# Patient Record
Sex: Male | Born: 1935 | Race: White | Hispanic: No | Marital: Married | Smoking: Former smoker
Health system: Southern US, Community
[De-identification: ages and names within clinical notes are randomized; demographics above are authoritative.]

## PROBLEM LIST (undated history)

## (undated) DIAGNOSIS — F419 Anxiety disorder, unspecified: Secondary | ICD-10-CM

## (undated) DIAGNOSIS — I639 Cerebral infarction, unspecified: Secondary | ICD-10-CM

## (undated) DIAGNOSIS — E785 Hyperlipidemia, unspecified: Secondary | ICD-10-CM

## (undated) DIAGNOSIS — G629 Polyneuropathy, unspecified: Secondary | ICD-10-CM

## (undated) DIAGNOSIS — I739 Peripheral vascular disease, unspecified: Secondary | ICD-10-CM

## (undated) DIAGNOSIS — I340 Nonrheumatic mitral (valve) insufficiency: Secondary | ICD-10-CM

## (undated) DIAGNOSIS — I255 Ischemic cardiomyopathy: Secondary | ICD-10-CM

## (undated) DIAGNOSIS — R7989 Other specified abnormal findings of blood chemistry: Secondary | ICD-10-CM

## (undated) DIAGNOSIS — Z951 Presence of aortocoronary bypass graft: Secondary | ICD-10-CM

## (undated) DIAGNOSIS — I10 Essential (primary) hypertension: Secondary | ICD-10-CM

## (undated) DIAGNOSIS — R29898 Other symptoms and signs involving the musculoskeletal system: Secondary | ICD-10-CM

## (undated) DIAGNOSIS — I2510 Atherosclerotic heart disease of native coronary artery without angina pectoris: Secondary | ICD-10-CM

## (undated) DIAGNOSIS — Z7901 Long term (current) use of anticoagulants: Secondary | ICD-10-CM

## (undated) DIAGNOSIS — I493 Ventricular premature depolarization: Secondary | ICD-10-CM

## (undated) DIAGNOSIS — I509 Heart failure, unspecified: Secondary | ICD-10-CM

## (undated) DIAGNOSIS — I779 Disorder of arteries and arterioles, unspecified: Secondary | ICD-10-CM

## (undated) DIAGNOSIS — IMO0002 Reserved for concepts with insufficient information to code with codable children: Secondary | ICD-10-CM

## (undated) DIAGNOSIS — I4891 Unspecified atrial fibrillation: Secondary | ICD-10-CM

## (undated) DIAGNOSIS — R9430 Abnormal result of cardiovascular function study, unspecified: Secondary | ICD-10-CM

## (undated) DIAGNOSIS — I358 Other nonrheumatic aortic valve disorders: Secondary | ICD-10-CM

## (undated) DIAGNOSIS — K219 Gastro-esophageal reflux disease without esophagitis: Secondary | ICD-10-CM

## (undated) HISTORY — DX: Presence of aortocoronary bypass graft: Z95.1

## (undated) HISTORY — DX: Atherosclerotic heart disease of native coronary artery without angina pectoris: I25.10

## (undated) HISTORY — DX: Other specified abnormal findings of blood chemistry: R79.89

## (undated) HISTORY — DX: Nonrheumatic mitral (valve) insufficiency: I34.0

## (undated) HISTORY — DX: Hyperlipidemia, unspecified: E78.5

## (undated) HISTORY — DX: Peripheral vascular disease, unspecified: I73.9

## (undated) HISTORY — DX: Ventricular premature depolarization: I49.3

## (undated) HISTORY — DX: Other nonrheumatic aortic valve disorders: I35.8

## (undated) HISTORY — DX: Disorder of arteries and arterioles, unspecified: I77.9

## (undated) HISTORY — DX: Gastro-esophageal reflux disease without esophagitis: K21.9

## (undated) HISTORY — DX: Essential (primary) hypertension: I10

## (undated) HISTORY — DX: Other symptoms and signs involving the musculoskeletal system: R29.898

## (undated) HISTORY — DX: Unspecified atrial fibrillation: I48.91

## (undated) HISTORY — DX: Reserved for concepts with insufficient information to code with codable children: IMO0002

## (undated) HISTORY — DX: Ischemic cardiomyopathy: I25.5

## (undated) HISTORY — DX: Cerebral infarction, unspecified: I63.9

## (undated) HISTORY — DX: Long term (current) use of anticoagulants: Z79.01

## (undated) HISTORY — DX: Heart failure, unspecified: I50.9

## (undated) HISTORY — DX: Polyneuropathy, unspecified: G62.9

## (undated) HISTORY — DX: Anxiety disorder, unspecified: F41.9

## (undated) HISTORY — DX: Abnormal result of cardiovascular function study, unspecified: R94.30

---

## 1996-05-01 HISTORY — PX: CORONARY ARTERY BYPASS GRAFT: SHX141

## 1999-12-20 ENCOUNTER — Encounter: Payer: Self-pay | Admitting: Gastroenterology

## 1999-12-20 ENCOUNTER — Ambulatory Visit (HOSPITAL_COMMUNITY): Admission: RE | Admit: 1999-12-20 | Discharge: 1999-12-20 | Payer: Self-pay | Admitting: Gastroenterology

## 2002-05-09 ENCOUNTER — Encounter: Payer: Self-pay | Admitting: Specialist

## 2002-05-12 ENCOUNTER — Ambulatory Visit (HOSPITAL_COMMUNITY): Admission: RE | Admit: 2002-05-12 | Discharge: 2002-05-12 | Payer: Self-pay | Admitting: Specialist

## 2003-04-01 ENCOUNTER — Other Ambulatory Visit: Admission: RE | Admit: 2003-04-01 | Discharge: 2003-04-01 | Payer: Self-pay | Admitting: Dermatology

## 2004-06-06 ENCOUNTER — Ambulatory Visit: Payer: Self-pay | Admitting: Cardiology

## 2005-03-13 ENCOUNTER — Other Ambulatory Visit: Admission: RE | Admit: 2005-03-13 | Discharge: 2005-03-13 | Payer: Self-pay | Admitting: Dermatology

## 2005-06-20 ENCOUNTER — Ambulatory Visit: Payer: Self-pay | Admitting: Cardiology

## 2005-06-27 ENCOUNTER — Ambulatory Visit: Payer: Self-pay | Admitting: Cardiology

## 2007-03-19 ENCOUNTER — Ambulatory Visit: Payer: Self-pay | Admitting: Vascular Surgery

## 2007-06-03 ENCOUNTER — Ambulatory Visit: Payer: Self-pay | Admitting: Cardiology

## 2007-06-11 ENCOUNTER — Ambulatory Visit: Payer: Self-pay | Admitting: Cardiology

## 2007-06-11 ENCOUNTER — Encounter: Payer: Self-pay | Admitting: Cardiology

## 2007-07-12 ENCOUNTER — Ambulatory Visit: Payer: Self-pay | Admitting: Cardiology

## 2007-08-02 ENCOUNTER — Ambulatory Visit: Payer: Self-pay | Admitting: Cardiology

## 2007-09-05 ENCOUNTER — Ambulatory Visit: Payer: Self-pay | Admitting: Cardiology

## 2007-10-23 ENCOUNTER — Ambulatory Visit: Payer: Self-pay | Admitting: Cardiology

## 2007-10-23 ENCOUNTER — Encounter: Payer: Self-pay | Admitting: Cardiology

## 2007-11-05 ENCOUNTER — Ambulatory Visit: Payer: Self-pay | Admitting: Cardiology

## 2007-11-12 ENCOUNTER — Ambulatory Visit: Payer: Self-pay | Admitting: Vascular Surgery

## 2008-03-30 ENCOUNTER — Ambulatory Visit: Payer: Self-pay | Admitting: Cardiology

## 2008-04-07 ENCOUNTER — Encounter: Payer: Self-pay | Admitting: Cardiology

## 2008-05-08 ENCOUNTER — Ambulatory Visit: Payer: Self-pay | Admitting: Cardiology

## 2008-05-14 ENCOUNTER — Encounter: Payer: Self-pay | Admitting: Cardiology

## 2008-05-14 ENCOUNTER — Ambulatory Visit: Payer: Self-pay | Admitting: Cardiology

## 2008-06-15 ENCOUNTER — Encounter: Payer: Self-pay | Admitting: Cardiology

## 2008-06-20 ENCOUNTER — Encounter: Payer: Self-pay | Admitting: Cardiology

## 2008-06-23 ENCOUNTER — Encounter: Payer: Self-pay | Admitting: Cardiology

## 2008-09-07 ENCOUNTER — Encounter: Payer: Self-pay | Admitting: Cardiology

## 2008-11-16 ENCOUNTER — Ambulatory Visit: Payer: Self-pay | Admitting: Vascular Surgery

## 2008-11-18 ENCOUNTER — Encounter (INDEPENDENT_AMBULATORY_CARE_PROVIDER_SITE_OTHER): Payer: Self-pay | Admitting: *Deleted

## 2008-12-15 ENCOUNTER — Encounter (INDEPENDENT_AMBULATORY_CARE_PROVIDER_SITE_OTHER): Payer: Self-pay | Admitting: *Deleted

## 2008-12-16 ENCOUNTER — Encounter: Payer: Self-pay | Admitting: Physician Assistant

## 2008-12-16 ENCOUNTER — Ambulatory Visit: Payer: Self-pay | Admitting: Cardiology

## 2009-07-19 ENCOUNTER — Encounter: Payer: Self-pay | Admitting: Cardiology

## 2009-07-21 ENCOUNTER — Ambulatory Visit: Payer: Self-pay | Admitting: Cardiology

## 2009-07-28 ENCOUNTER — Telehealth (INDEPENDENT_AMBULATORY_CARE_PROVIDER_SITE_OTHER): Payer: Self-pay | Admitting: *Deleted

## 2009-08-06 ENCOUNTER — Encounter (INDEPENDENT_AMBULATORY_CARE_PROVIDER_SITE_OTHER): Payer: Self-pay | Admitting: Neurology

## 2009-08-06 ENCOUNTER — Ambulatory Visit: Payer: Self-pay | Admitting: Internal Medicine

## 2009-08-06 ENCOUNTER — Encounter (INDEPENDENT_AMBULATORY_CARE_PROVIDER_SITE_OTHER): Payer: Self-pay | Admitting: Internal Medicine

## 2009-08-06 ENCOUNTER — Inpatient Hospital Stay (HOSPITAL_COMMUNITY): Admission: AD | Admit: 2009-08-06 | Discharge: 2009-08-10 | Payer: Self-pay | Admitting: Internal Medicine

## 2009-08-06 ENCOUNTER — Encounter: Payer: Self-pay | Admitting: Cardiology

## 2009-08-08 ENCOUNTER — Encounter (INDEPENDENT_AMBULATORY_CARE_PROVIDER_SITE_OTHER): Payer: Self-pay | Admitting: Internal Medicine

## 2009-08-09 ENCOUNTER — Encounter (INDEPENDENT_AMBULATORY_CARE_PROVIDER_SITE_OTHER): Payer: Self-pay | Admitting: Internal Medicine

## 2009-08-10 ENCOUNTER — Encounter: Payer: Self-pay | Admitting: Internal Medicine

## 2009-12-15 ENCOUNTER — Ambulatory Visit: Payer: Self-pay | Admitting: Vascular Surgery

## 2009-12-15 ENCOUNTER — Encounter: Payer: Self-pay | Admitting: Cardiology

## 2010-01-31 ENCOUNTER — Encounter: Payer: Self-pay | Admitting: Cardiology

## 2010-02-01 ENCOUNTER — Ambulatory Visit: Payer: Self-pay | Admitting: Cardiology

## 2010-02-07 ENCOUNTER — Encounter: Payer: Self-pay | Admitting: Cardiology

## 2010-02-10 ENCOUNTER — Encounter (INDEPENDENT_AMBULATORY_CARE_PROVIDER_SITE_OTHER): Payer: Self-pay | Admitting: *Deleted

## 2010-05-31 NOTE — Miscellaneous (Signed)
  Clinical Lists Changes  Problems: Removed problem of MITRAL VALVE DISORDERS (ICD-424.0) Removed problem of COR ATHEROSLERO UNSPEC TYPE VESSEL NATIVE/GRAFT (ICD-414.00) Added new problem of CAD (ICD-414.00) Added new problem of CORONARY ARTERY BYPASS GRAFT, HX OF (ICD-V45.81) Added new problem of GERD (ICD-530.81) Added new problem of * ANXIETY Added new problem of * PERIPHERAL NEUROPATHY Added new problem of MITRAL REGURGITATION (ICD-396.3) Added new problem of * PVCS Observations: Added new observation of PAST MED HX: Ischemic cardiomyopathy. EF  40-45%..  (better than before).... echo... January, 2010.... ICD not indicated CHF.... systolic CABG.... 1998 CAD....    nuclear August of 2005... extensive lateral and inferolateral scar.... no ischemia Dyslipidemia. Hypertension. GERD Anxiety. carotid artery disease..... Dr. Cari Caraway.Marland KitchenMarland KitchenMarland KitchenDoppler... July, 2009... 0-39% RICA, 40-59% LICA Peripheral neuropathy. Question of peripheral vascular disease in his legs. continued spells of weakness in his legs. Not clear if vascular or neurologic. Arterial leg Dopplers normal, December 2009. aortic leaflet calcification...mild... no significant aortic stenosis.  Mitral regurgitation, mild by echo, January 2010. PVCs  (07/19/2009 16:37) Added new observation of PRIMARY MD: Donzetta Sprung, MD (07/19/2009 16:37)       Past History:  Past Medical History: Ischemic cardiomyopathy. EF  40-45%..  (better than before).... echo... January, 2010.... ICD not indicated CHF.... systolic CABG.... 1998 CAD....    nuclear August of 2005... extensive lateral and inferolateral scar.... no ischemia Dyslipidemia. Hypertension. GERD Anxiety. carotid artery disease..... Dr. Cari Caraway.Marland KitchenMarland KitchenMarland KitchenDoppler... July, 2009... 0-39% RICA, 40-59% LICA Peripheral neuropathy. Question of peripheral vascular disease in his legs. continued spells of weakness in his legs. Not clear if vascular or neurologic. Arterial  leg Dopplers normal, December 2009. aortic leaflet calcification...mild... no significant aortic stenosis.  Mitral regurgitation, mild by echo, January 2010. PVCs

## 2010-05-31 NOTE — Assessment & Plan Note (Signed)
Summary: 6 mon ful fholt   Visit Type:  Follow-up Referring Provider:  Rinaldo Ratel Primary Provider:  Donzetta Sprung, MD  CC:  CAD  CVA.  History of Present Illness: The patient is seen post hospitalization.  He had a CVA and was treated at [facility].  He presented with the acute onset of left lower arm weakness and inability to respond speech appropriately.  This all improved.  It was noted that he did have some paroxysmal atrial fibrillation that has not been seen before.  It was felt that his event was cardioembolic.  He was placed on Pradaxa.  He has done well since.  I have reviewed the ostial consultation was done by cardiology during that time.  He had some mild congestive heart failure with slight troponin elevation.  This was treated as heart failure and it was felt that he did not need cardiac catheterization.  Since that time he has also been seen by Dr. Edilia Bo.  His carotid Dopplers are stable.  I have reviewed the entire discharge summary from his hospitalization very carefully.  Preventive Screening-Counseling & Management  Alcohol-Tobacco     Smoking Status: quit     Year Quit: 1984  Current Medications (verified): 1)  Losartan Potassium 100 Mg Tabs (Losartan Potassium) .... Take 1 Tablet By Mouth Once A Day 2)  Omeprazole 20 Mg Cpdr (Omeprazole) .... Once Daily 3)  Simvastatin 40 Mg Tabs (Simvastatin) .... Once Daily 4)  Gabapentin 300 Mg Caps (Gabapentin) .... Two Times A Day 5)  Flomax 0.4 Mg Xr24h-Cap (Tamsulosin Hcl) .... Once Daily 6)  Diazepam 5 Mg Tabs (Diazepam) .... Take 1/2 Tablet By Mouth Two Times A Day 7)  Carvedilol 25 Mg Tabs (Carvedilol) .... Take 1 Tablet By Mouth Once A Day 8)  Travatan Z 0.004 % Soln (Travoprost) .Marland Kitchen.. 1 Drop To (R) Eye Every Evening 9)  Brimonidine Tartrate 0.15 % Soln (Brimonidine Tartrate) .... 2 Drops in (R) Eye Two Times A Day 10)  Artificial Tears  Soln (Artificial Tear Solution) .... (L) Eye Once Daily 11)  Cardizem Cd 180 Mg  Xr24h-Cap (Diltiazem Hcl Coated Beads) .... Take 1 Tablet By Mouth Once A Day 12)  Metformin Hcl 500 Mg Tabs (Metformin Hcl) .... Take 1 Tablet By Mouth Two Times A Day 13)  Loratadine 10 Mg Tabs (Loratadine) .... Take 1 Tablet By Mouth Once A Day 14)  Pradaxa 150 Mg Caps (Dabigatran Etexilate Mesylate) .... Take 1 Tablet By Mouth Two Times A Day  Allergies (verified): No Known Drug Allergies  Comments:  Nurse/Medical Assistant: The patient's medication list and allergies were reviewed with the patient and were updated in the Medication and Allergy Lists.  Past History:  Past Medical History: Ischemic cardiomyopathy. EF  40-45%..  (better than before).... echo... January, 2010.... ICD not indicated /  EF 40%...echo..08/06/2009 CHF.... systolic CABG.... 1998 CAD....    nuclear August of 2005... extensive lateral and inferolateral scar.... no ischemia Dyslipidemia. DM   diagnosed..07/2009 Hypertension. GERD Anxiety. carotid artery disease..... Dr. Cari Caraway.Marland KitchenMarland KitchenMarland KitchenDoppler... July, 2009... 0-39% RICA, 40-59% LICA /  doppler..11/2009.Marland Kitchen40-59% LICA per Dr. Edilia Bo CVA   07/2009...likely cardioembolic Atrial fib/ flutter rate contralled...Marland KitchenMarland Kitchen4/2011.Marland KitchenCoumadin Coumadin   begun 07/2009 Peripheral neuropathy. Question of peripheral vascular disease in his legs. continued spells of weakness in his legs. Not clear if vascular or neurologic. Arterial leg Dopplers normal, December 2009. aortic leaflet calcification...mild... no significant aortic stenosis.  Mitral regurgitation, mild by echo, January 2010. PVCs  Review of Systems  Patient denies fever, chills, headache, sweats, rash, change in vision, change in hearing, chest pain, cough, nausea vomiting, urinary symptoms.  All other systems are reviewed and are negative.  Vital Signs:  Patient profile:   77 year old male Height:      73 inches Weight:      251 pounds BMI:     33.24 Pulse rate:   52 / minute BP sitting:   118 / 82   (left arm) Cuff size:   large  Vitals Entered By: Carlye Grippe (February 01, 2010 2:59 PM)  Nutrition Counseling: Patient's BMI is greater than 25 and therefore counseled on weight management options.  Physical Exam  General:  patient is stable today. Head:  head is atraumatic. Eyes:  no xanthelasma. Neck:  no jugular venous stent. Chest Wall:  no chest wall tenderness. Lungs:  lungs are clear.  Respiratory effort is nonlabored. Heart:  cardiac exam reveals S1 and S2.  No clicks or significant murmurs. Abdomen:  abdomen is soft. Msk:  no musculoskeletal deformities. Extremities:  no peripheral edema. Skin:  no skin rashes. Psych:  patient is oriented to person time and place.  Affect is normal.  He is here with his wife today.   Impression & Recommendations:  Problem # 1:  * PRADAXA RX The patient was placed on this medication for his cardioembolic event.  I am in agreement with this.  His renal function is good.  We will plan followup hematology studies appropriately.  This will be long-term therapy.  Problem # 2:  CVA (ICD-434.91)  The following medications were removed from the medication list:    Aspirin Low Dose 81 Mg Tabs (Aspirin) ..... Once daily Fortunately he is recovered from his CVA.  The only residual is very slight tingling in his left hand.  He is back to full activities.  This will be treated as a cardioembolic event.  There was some atrial fibrillation seen at the time of his hospitalization.  Problem # 3:  MITRAL REGURGITATION (ICD-396.3) This is not a significant problem at this time.  Followup echo was not needed.  Problem # 4:  CAD (ICD-414.00)  The following medications were removed from the medication list:    Lisinopril 40 Mg Tabs (Lisinopril) ..... Once daily    Aspirin Low Dose 81 Mg Tabs (Aspirin) ..... Once daily His updated medication list for this problem includes:    Carvedilol 25 Mg Tabs (Carvedilol) .Marland Kitchen... Take 1 tablet by mouth once a day     Cardizem Cd 180 Mg Xr24h-cap (Diltiazem hcl coated beads) .Marland Kitchen... Take 1 tablet by mouth once a day Coronary disease is stable.  He had slight troponin elevation the hospital related to CHF.  Problem # 5:  CAROTID ARTERY DISEASE (ICD-433.10)  The following medications were removed from the medication list:    Aspirin Low Dose 81 Mg Tabs (Aspirin) ..... Once daily Carotid artery disease is stable.  I reviewed the note from his vascular surgeon.  Problem # 6:  ESSENTIAL HYPERTENSION, BENIGN (ICD-401.1)  The following medications were removed from the medication list:    Lisinopril 40 Mg Tabs (Lisinopril) ..... Once daily    Aspirin Low Dose 81 Mg Tabs (Aspirin) ..... Once daily His updated medication list for this problem includes:    Losartan Potassium 100 Mg Tabs (Losartan potassium) .Marland Kitchen... Take 1 tablet by mouth once a day    Carvedilol 25 Mg Tabs (Carvedilol) .Marland Kitchen... Take 1 tablet by mouth once a day  Cardizem Cd 180 Mg Xr24h-cap (Diltiazem hcl coated beads) .Marland Kitchen... Take 1 tablet by mouth once a day Blood pressure stable.  No change in therapy.  Other Orders: T-Basic Metabolic Panel [phone]) T-CBC No Diff ([id number]) T-Hepatic Function [phone])  Patient Instructions: 1)  Your physician wants you to follow-up in: 4 months. You will receive a reminder letter in the mail one-two months in advance. If you don't receive a letter, please call our office to schedule the follow-up appointment. 2)  Your physician recommends that you go to the [facility] for lab work: Do not eat or drink after midnight.  3)  Your physician recommends that you continue on your current medications as directed. Please refer to the Current Medication list given to you today.

## 2010-05-31 NOTE — Progress Notes (Signed)
Summary: PHONE: LETTER  Phone Note Call from Patient Call back at Home Phone [phone]   Caller: Patient   [phone]  work # Reason for Call: Talk to Doctor Summary of Call: Mr. Pietrzyk states that when he was seen in office earlier this month that Dr. Myrtis Ser was going to write a letter to Dr. Chaney Malling regarding his knees. Do not see a note in regards to this. Initial call taken by: Zachary George,  July 28, 2009 10:36 AM  Follow-up for Phone Call        the origininal office note was sent to [facility] in [place] which contained message r/e knee discomfort. nurse resent office note to [facility]. faxed to [phone]. Patient informed of the above.   Follow-up by: Carlye Grippe,  July 28, 2009 4:59 PM

## 2010-05-31 NOTE — Miscellaneous (Signed)
  Clinical Lists Changes  Problems: Added new problem of CVA (ICD-434.91) Observations: Added new observation of PAST MED HX: Ischemic cardiomyopathy. EF  40-45%..  (better than before).... echo... January, 2010.... ICD not indicated /  EF 40%...echo..08/06/2009 CHF.... systolic CABG.... 1998 CAD....    nuclear August of 2005... extensive lateral and inferolateral scar.... no ischemia Dyslipidemia. DM   diagnosed..07/2009 Hypertension. GERD Anxiety. carotid artery disease..... Dr. Cari Caraway.Marland KitchenMarland KitchenMarland KitchenDoppler... July, 2009... 0-39% RICA, 40-59% LICA /  doppler..11/2009.Marland Kitchen40-59% LICA per Dr. Edilia Bo CVA   07/2009...likely cardioembolic Atrial fib/ flutter rate contralled...Marland KitchenMarland Kitchen4/2011.Marland KitchenCoumadin Coumadin   begun 07/2009 Peripheral neuropathy. Question of peripheral vascular disease in his legs. continued spells of weakness in his legs. Not clear if vascular or neurologic. Arterial leg Dopplers normal, December 2009. aortic leaflet calcification...mild... no significant aortic stenosis.  Mitral regurgitation, mild by echo, January 2010. PVCs  (01/31/2010 10:50) Added new observation of REFERRING MD: Rinaldo Ratel (01/31/2010 10:50) Added new observation of PRIMARY MD: Donzetta Sprung, MD (01/31/2010 10:50)       Past History:  Past Medical History: Ischemic cardiomyopathy. EF  40-45%..  (better than before).... echo... January, 2010.... ICD not indicated /  EF 40%...echo..08/06/2009 CHF.... systolic CABG.... 1998 CAD....    nuclear August of 2005... extensive lateral and inferolateral scar.... no ischemia Dyslipidemia. DM   diagnosed..07/2009 Hypertension. GERD Anxiety. carotid artery disease..... Dr. Cari Caraway.Marland KitchenMarland KitchenMarland KitchenDoppler... July, 2009... 0-39% RICA, 40-59% LICA /  doppler..11/2009.Marland Kitchen40-59% LICA per Dr. Edilia Bo CVA   07/2009...likely cardioembolic Atrial fib/ flutter rate contralled...Marland KitchenMarland Kitchen4/2011.Marland KitchenCoumadin Coumadin   begun 07/2009 Peripheral neuropathy. Question of peripheral vascular  disease in his legs. continued spells of weakness in his legs. Not clear if vascular or neurologic. Arterial leg Dopplers normal, December 2009. aortic leaflet calcification...mild... no significant aortic stenosis.  Mitral regurgitation, mild by echo, January 2010. PVCs

## 2010-05-31 NOTE — Assessment & Plan Note (Signed)
Summary: 6 MO FU PER FEB REMINDER-SRS   Visit Type:  Follow-up Referring Provider:  Rinaldo Ratel Primary Provider:  Donzetta Sprung, MD  CC:  CAD.  History of Present Illness: The patient is seen for followup of coronary artery disease.  I saw him last August, 2010.  In January of [address]84 the echo had been done to reassess his LV function and ejection fraction was 40-45%.  There is a history of CABG 1998.  Nuclear scan was done 2005.  As no ischemia then.  Is not having chest pain.  The patient is very limited by his knee discomfort.  He is completely limited by this.  I certainly favor a more complete workup.  Preventive Screening-Counseling & Management  Alcohol-Tobacco     Smoking Status: quit  Comments: Pt quit about 23 yrs ago. Smoked for about 15-20 yrs  Current Medications (verified): 1)  Lisinopril 40 Mg Tabs (Lisinopril) .... Once Daily 2)  Amlodipine Besylate 5 Mg Tabs (Amlodipine Besylate) .... Once Daily 3)  Omeprazole 20 Mg Cpdr (Omeprazole) .... Once Daily 4)  Simvastatin 40 Mg Tabs (Simvastatin) .... Once Daily 5)  Gabapentin 300 Mg Caps (Gabapentin) .... Two Times A Day 6)  Flomax 0.4 Mg Xr24h-Cap (Tamsulosin Hcl) .... Once Daily 7)  Aspirin Low Dose 81 Mg Tabs (Aspirin) .... Once Daily 8)  Diazepam 5 Mg Tabs (Diazepam) .... 1/2 Tab 2  Times A Day 9)  Carvedilol 25 Mg Tabs (Carvedilol) .... Two Times A Day 10)  Mentax 1 % Crea (Butenafine Hcl) .... As Needed 11)  Travatan Z 0.004 % Soln (Travoprost) .Marland Kitchen.. 1 Drop To (R) Eye Every Evening 12)  Brimonidine Tartrate 0.15 % Soln (Brimonidine Tartrate) .... 2 Drops in (R) Eye Two Times A Day 13)  Artificial Tears  Soln (Artificial Tear Solution) .... (L) Eye Once Daily  Allergies: No Known Drug Allergies  Comments:  Nurse/Medical Assistant: The patient's medications were reviewed with the patient and were updated in the Medication List. Pt brought a list of medications to office visit.  Cyril Loosen, RN, BSN  (July 21, 2009 12:32 PM)  Past History:  Past Medical History: Last updated: 07/19/2009 Ischemic cardiomyopathy. EF  40-45%..  (better than before).... echo... January, 2010.... ICD not indicated CHF.... systolic CABG.... 1998 CAD....    nuclear August of 2005... extensive lateral and inferolateral scar.... no ischemia Dyslipidemia. Hypertension. GERD Anxiety. carotid artery disease..... Dr. Cari Caraway.Marland KitchenMarland KitchenMarland KitchenDoppler... July, 2009... 0-39% RICA, 40-59% LICA Peripheral neuropathy. Question of peripheral vascular disease in his legs. continued spells of weakness in his legs. Not clear if vascular or neurologic. Arterial leg Dopplers normal, December 2009. aortic leaflet calcification...mild... no significant aortic stenosis.  Mitral regurgitation, mild by echo, January 2010. PVCs  Social History: Smoking Status:  quit  Review of Systems       Patient denies fever, chills, headache, sweats, rash, change in vision, change in hearing, chest pain, cough, shortness of breath.  He has no nausea vomiting or urinary symptoms.  He is very limited by his knee discomfort. all other systems are reviewed and are negative.  Vital Signs:  Patient profile:   77 year old male Height:      73 inches Weight:      262.25 pounds Pulse rate:   58 / minute BP sitting:   115 / 69  (left arm) Cuff size:   large  Vitals Entered By: Cyril Loosen, RN, BSN (July 21, 2009 12:26 PM) CC: CAD Comments No cardiac compliants   Physical  Exam  General:  The patient is stable. Eyes:  no xanthelasma. Neck:  no jugular venous distention. Lungs:  lungs are clear.  Respiratory effort is nonlabored. Heart:  cardiac exam is S1-S2.  No clicks or significant murmurs. Abdomen:  abdomen is soft. Extremities:  no peripheral edema. Psych:  patient is oriented to person time and place.  Affect is normal.   Impression & Recommendations:  Problem # 1:  * PVCS Patient is not bothered by palpitations at this  time.  No further workup.  Problem # 2:  MITRAL REGURGITATION (ICD-396.3) The patient does not need followup echo at this time.  Problem # 3:  CAD (ICD-414.00)  His updated medication list for this problem includes:    Lisinopril 40 Mg Tabs (Lisinopril) ..... Once daily    Amlodipine Besylate 5 Mg Tabs (Amlodipine besylate) ..... Once daily    Aspirin Low Dose 81 Mg Tabs (Aspirin) ..... Once daily    Carvedilol 25 Mg Tabs (Carvedilol) .Marland Kitchen..Marland Kitchen Two times a day Coronary disease is stable.  I've chosen not to proceed with any type of exercise testing at this time.  His cholesterol is under good control.  Problem # 4:  CAROTID ARTERY DISEASE (ICD-433.10)  His updated medication list for this problem includes:    Aspirin Low Dose 81 Mg Tabs (Aspirin) ..... Once daily His carotid disease is followed very carefully by Dr. Quintella Baton.  No further workup.  Problem # 5:  * KNEE DISCOMFORT Patient is extremely limited by dyspnea discomfort.  It is my understanding that the insurance company requested rehabilitation before they were willing to consider thing for MRI.  The patient is not able to go through a rehabilitation.  He is affected significantly and needs further evaluation of his knees.  I am hopeful that the insurance company will reconsider.  Patient Instructions: 1)  Your physician recommends that you continue on your current medications as directed. Please refer to the Current Medication list given to you today. 2)  Your physician wants you to follow-up in: 6months. You will receive a reminder letter in the mail about two months in advance. If you don't receive a letter, please call our office to schedule the follow-up appointment.

## 2010-05-31 NOTE — Consult Note (Signed)
Summary: [facility]   Imported By: Marylou Mccoy 11/23/2009 17:44:30  _____________________________________________________________________  External Attachment:    Type:   Image     Comment:   External Document

## 2010-05-31 NOTE — Letter (Signed)
Summary: [facility] New Patient Consult   [facility] New Patient Consult   Imported By: Roderic Ovens 01/14/2010 11:14:33  _____________________________________________________________________  External Attachment:    Type:   Image     Comment:   External Document

## 2010-05-31 NOTE — Letter (Signed)
Summary: Engineer, materials at [facility]  [address]   [place], [place] [address]   Phone: [phone]  Fax: [phone]        February 10, 2010 MRN: [id number]    [facility] [place], [place]  [address]    Dear Mr. Blansett,  Your test ordered by Selena Batten has been reviewed by your physician (or physician assistant) and was found to be normal or stable. Your physician (or physician assistant) felt no changes were needed at this time.  ____ Echocardiogram  ____ Cardiac Stress Test  __X__ Lab Work  ____ Peripheral vascular study of arms, legs or neck  ____ CT scan or X-ray  ____ Lung or Breathing test  ____ Other:   Thank you.   Cyril Loosen, RN, BSN    Duane Boston, M.D., F.A.C.C. Thressa Sheller, M.D., F.A.C.C. Oneal Grout, M.D., F.A.C.C. Cheree Ditto, M.D., F.A.C.C. Daiva Nakayama, M.D., F.A.C.C. Kenney Houseman, M.D., F.A.C.C. Jeanne Ivan, PA-C

## 2010-07-04 ENCOUNTER — Encounter: Payer: Self-pay | Admitting: Cardiology

## 2010-07-04 ENCOUNTER — Ambulatory Visit (INDEPENDENT_AMBULATORY_CARE_PROVIDER_SITE_OTHER): Payer: MEDICARE | Admitting: Cardiology

## 2010-07-04 DIAGNOSIS — I4891 Unspecified atrial fibrillation: Secondary | ICD-10-CM

## 2010-07-12 NOTE — Assessment & Plan Note (Signed)
Summary: 4 month fu-recv reminder -vs   Visit Type:  Follow-up Referring Provider:  Rinaldo Ratel Primary Provider:  Donzetta Sprung, MD  CC:  CAD.  History of Present Illness: Patient is seen for followup of coronary disease, left ventricular dysfunction, carotid artery disease, atrial fibrillation, Coumadin therapy.  Overall he is doing well.  He's not having any chest pain or significant shortness of breath.  Preventive Screening-Counseling & Management  Alcohol-Tobacco     Smoking Status: quit     Year Quit: 1984  Current Medications (verified): 1)  Losartan Potassium 100 Mg Tabs (Losartan Potassium) .... Take 1 Tablet By Mouth Once A Day 2)  Omeprazole 20 Mg Cpdr (Omeprazole) .... Once Daily 3)  Simvastatin 40 Mg Tabs (Simvastatin) .... Once Daily 4)  Gabapentin 300 Mg Caps (Gabapentin) .... Two Times A Day 5)  Flomax 0.4 Mg Xr24h-Cap (Tamsulosin Hcl) .... Once Daily 6)  Diazepam 5 Mg Tabs (Diazepam) .... Take 1/2 Tablet By Mouth Two Times A Day 7)  Carvedilol 25 Mg Tabs (Carvedilol) .... Take 1 Tablet By Mouth Once A Day 8)  Travatan Z 0.004 % Soln (Travoprost) .Marland Kitchen.. 1 Drop To (R) Eye Every Evening 9)  Brimonidine Tartrate 0.15 % Soln (Brimonidine Tartrate) .... 2 Drops in (R) Eye Two Times A Day 10)  Artificial Tears  Soln (Artificial Tear Solution) .... (L) Eye Once Daily 11)  Cardizem Cd 180 Mg Xr24h-Cap (Diltiazem Hcl Coated Beads) .... Take 1 Tablet By Mouth Once A Day 12)  Metformin Hcl 500 Mg Tabs (Metformin Hcl) .... Take 1 Tablet By Mouth Two Times A Day 13)  Loratadine 10 Mg Tabs (Loratadine) .... Take 1 Tablet By Mouth Once A Day 14)  Pradaxa 150 Mg Caps (Dabigatran Etexilate Mesylate) .... Take 1 Tablet By Mouth Two Times A Day  Allergies (verified): No Known Drug Allergies  Comments:  Nurse/Medical Assistant: The patient's medication list and allergies were reviewed with the patient and were updated in the Medication and Allergy Lists.  Past  History:  Past Medical History: Ischemic cardiomyopathy. EF  40-45%..  (better than before).... echo... January, 2010.... ICD not indicated /  EF 40%...echo..08/06/2009 CHF.... systolic CABG.... 1998 CAD....    nuclear August of 2005... extensive lateral and inferolateral scar.... no ischemia Dyslipidemia. DM   diagnosed..07/2009 Hypertension. GERD Anxiety. carotid artery disease..... Dr. Cari Caraway.Marland KitchenMarland KitchenMarland KitchenDoppler... July, 2009... 0-39% RICA, 40-59% LICA /  doppler..11/2009.Marland Kitchen40-59% LICA per Dr. Edilia Bo CVA   07/2009...likely cardioembolic.. Atrial fib/ flutter rate contralled...Marland KitchenMarland Kitchen4/2011.Marland KitchenCoumadin Coumadin   begun 07/2009 Peripheral neuropathy. Question of peripheral vascular disease in his legs. continued spells of weakness in his legs. Not clear if vascular or neurologic. Arterial leg Dopplers normal, December 2009. aortic leaflet calcification...mild... no significant aortic stenosis.  Mitral regurgitation, mild by echo, January 2010. PVCs  Review of Systems       Patient denies fever, chills, headache, sweats, rash, change in vision, change in hearing, chest pain, cough, nausea vomiting, urinary symptoms.  All other systems are reviewed and are negative. He does have problems with his legs.  Vital Signs:  Patient profile:   77 year old male Height:      73 inches Weight:      257 pounds O2 Sat:      94 % on Room air Pulse rate:   61 / minute BP sitting:   128 / 63  (left arm) Cuff size:   large  Vitals Entered By: Carlye Grippe (July 04, 2010 2:36 PM)  O2 Flow:  Room  air  Physical Exam  General:  patient is stable. Eyes:  no xanthelasma. Neck:  no jugular venous distention. Lungs:  lungs are clear respiratory effort is nonlabored. Heart:  cardiac exam reveals S1-S2.  Is irregular with scattered PVCs. Abdomen:  abdomen soft. Extremities:  no peripheral edema. Psych:  patient is oriented to person time and place.  Affect is normal.   Impression &  Recommendations:  Problem # 1:  * PRADAXA RX The patient continues on this medication and is doing well.  Problem # 2:  CVA (ICD-434.91) Patient continues to have slight difficulty with his left hand.  Otherwise he is stable  Problem # 3:  CAD (ICD-414.00) Coronary disease is stable.  EKG is done today and reviewed by me.  There is no significant change.  He does have scattered PVCs.  There is sinus rhythm.  Problem # 4:  ESSENTIAL HYPERTENSION, BENIGN (ICD-401.1) Blood pressure is well-controlled.  No change in therapy.  We'll see him back in 9 months for cardiology followup.  Other Orders: EKG w/ Interpretation (93000)  Patient Instructions: 1)  Your physician wants you to follow-up in: 9 months. You will receive a reminder letter in the mail one-two months in advance. If you don't receive a letter, please call our office to schedule the follow-up appointment. 2)  Your physician recommends that you continue on your current medications as directed. Please refer to the Current Medication list given to you today.

## 2010-07-20 LAB — CBC
HCT: 43.2 % (ref 39.0–52.0)
Hemoglobin: 13.6 g/dL (ref 13.0–17.0)
MCHC: 33.5 g/dL (ref 30.0–36.0)
MCHC: 33.6 g/dL (ref 30.0–36.0)
MCV: 97.2 fL (ref 78.0–100.0)
MCV: 98.2 fL (ref 78.0–100.0)
Platelets: 216 10*3/uL (ref 150–400)
RBC: 4.14 MIL/uL — ABNORMAL LOW (ref 4.22–5.81)
RDW: 13.7 % (ref 11.5–15.5)
WBC: 7.8 10*3/uL (ref 4.0–10.5)

## 2010-07-20 LAB — BASIC METABOLIC PANEL
BUN: 10 mg/dL (ref 6–23)
CO2: 30 mEq/L (ref 19–32)
CO2: 30 mEq/L (ref 19–32)
Calcium: 9 mg/dL (ref 8.4–10.5)
Chloride: 101 mEq/L (ref 96–112)
Chloride: 104 mEq/L (ref 96–112)
Creatinine, Ser: 1.08 mg/dL (ref 0.4–1.5)
GFR calc Af Amer: >60 mL/min (ref >60)
Glucose, Bld: 174 mg/dL — ABNORMAL HIGH (ref 70–99)
Potassium: 3.6 mEq/L (ref 3.5–5.1)
Sodium: 139 mEq/L (ref 135–145)

## 2010-07-20 LAB — LIPID PANEL
HDL: 35 mg/dL — ABNORMAL LOW (ref >39)
LDL Cholesterol: 42 mg/dL (ref 0–99)
Total CHOL/HDL Ratio: 2.9 RATIO
VLDL: 23 mg/dL (ref 0–40)

## 2010-07-20 LAB — PROTIME-INR
Prothrombin Time: 13.7 seconds (ref 11.6–15.2)
Prothrombin Time: 14.2 seconds (ref 11.6–15.2)
Prothrombin Time: 14.2 seconds (ref 11.6–15.2)

## 2010-07-20 LAB — GLUCOSE, CAPILLARY
Glucose-Capillary: 152 mg/dL — ABNORMAL HIGH (ref 70–99)
Glucose-Capillary: 152 mg/dL — ABNORMAL HIGH (ref 70–99)
Glucose-Capillary: 159 mg/dL — ABNORMAL HIGH (ref 70–99)
Glucose-Capillary: 162 mg/dL — ABNORMAL HIGH (ref 70–99)
Glucose-Capillary: 168 mg/dL — ABNORMAL HIGH (ref 70–99)
Glucose-Capillary: 183 mg/dL — ABNORMAL HIGH (ref 70–99)

## 2010-07-20 LAB — CARDIAC PANEL(CRET KIN+CKTOT+MB+TROPI)
CK, MB: 2.8 ng/mL (ref 0.3–4.0)
CK, MB: 2.9 ng/mL (ref 0.3–4.0)
Relative Index: 2.6 — ABNORMAL HIGH (ref 0.0–2.5)
Total CK: 106 U/L (ref 7–232)
Total CK: 108 U/L (ref 7–232)
Troponin I: 0.2 ng/mL — ABNORMAL HIGH (ref 0.00–0.06)

## 2010-07-20 LAB — HEMOGLOBIN A1C: Mean Plasma Glucose: 180 mg/dL

## 2010-07-20 LAB — BRAIN NATRIURETIC PEPTIDE: Pro B Natriuretic peptide (BNP): 403 pg/mL — ABNORMAL HIGH (ref 0.0–100.0)

## 2010-08-08 DIAGNOSIS — R0602 Shortness of breath: Secondary | ICD-10-CM

## 2010-08-08 DIAGNOSIS — R7989 Other specified abnormal findings of blood chemistry: Secondary | ICD-10-CM

## 2010-08-09 DIAGNOSIS — I2510 Atherosclerotic heart disease of native coronary artery without angina pectoris: Secondary | ICD-10-CM

## 2010-08-09 DIAGNOSIS — I5023 Acute on chronic systolic (congestive) heart failure: Secondary | ICD-10-CM

## 2010-08-24 ENCOUNTER — Encounter: Payer: Self-pay | Admitting: Cardiology

## 2010-08-24 ENCOUNTER — Encounter: Payer: Self-pay | Admitting: *Deleted

## 2010-08-25 ENCOUNTER — Encounter: Payer: Self-pay | Admitting: *Deleted

## 2010-08-25 ENCOUNTER — Encounter: Payer: Self-pay | Admitting: Cardiology

## 2010-08-25 ENCOUNTER — Ambulatory Visit (INDEPENDENT_AMBULATORY_CARE_PROVIDER_SITE_OTHER): Payer: Medicare Other | Admitting: Cardiology

## 2010-08-25 DIAGNOSIS — I493 Ventricular premature depolarization: Secondary | ICD-10-CM

## 2010-08-25 DIAGNOSIS — I739 Peripheral vascular disease, unspecified: Secondary | ICD-10-CM

## 2010-08-25 DIAGNOSIS — E119 Type 2 diabetes mellitus without complications: Secondary | ICD-10-CM | POA: Insufficient documentation

## 2010-08-25 DIAGNOSIS — R9430 Abnormal result of cardiovascular function study, unspecified: Secondary | ICD-10-CM | POA: Insufficient documentation

## 2010-08-25 DIAGNOSIS — Z7901 Long term (current) use of anticoagulants: Secondary | ICD-10-CM | POA: Insufficient documentation

## 2010-08-25 DIAGNOSIS — I358 Other nonrheumatic aortic valve disorders: Secondary | ICD-10-CM

## 2010-08-25 DIAGNOSIS — I359 Nonrheumatic aortic valve disorder, unspecified: Secondary | ICD-10-CM

## 2010-08-25 DIAGNOSIS — I4891 Unspecified atrial fibrillation: Secondary | ICD-10-CM | POA: Insufficient documentation

## 2010-08-25 DIAGNOSIS — R7989 Other specified abnormal findings of blood chemistry: Secondary | ICD-10-CM

## 2010-08-25 DIAGNOSIS — I10 Essential (primary) hypertension: Secondary | ICD-10-CM

## 2010-08-25 DIAGNOSIS — Z951 Presence of aortocoronary bypass graft: Secondary | ICD-10-CM | POA: Insufficient documentation

## 2010-08-25 DIAGNOSIS — I509 Heart failure, unspecified: Secondary | ICD-10-CM

## 2010-08-25 DIAGNOSIS — K219 Gastro-esophageal reflux disease without esophagitis: Secondary | ICD-10-CM | POA: Insufficient documentation

## 2010-08-25 DIAGNOSIS — R791 Abnormal coagulation profile: Secondary | ICD-10-CM

## 2010-08-25 DIAGNOSIS — I4949 Other premature depolarization: Secondary | ICD-10-CM

## 2010-08-25 DIAGNOSIS — F419 Anxiety disorder, unspecified: Secondary | ICD-10-CM | POA: Insufficient documentation

## 2010-08-25 DIAGNOSIS — R29898 Other symptoms and signs involving the musculoskeletal system: Secondary | ICD-10-CM | POA: Insufficient documentation

## 2010-08-25 DIAGNOSIS — I2510 Atherosclerotic heart disease of native coronary artery without angina pectoris: Secondary | ICD-10-CM | POA: Insufficient documentation

## 2010-08-25 DIAGNOSIS — I639 Cerebral infarction, unspecified: Secondary | ICD-10-CM | POA: Insufficient documentation

## 2010-08-25 DIAGNOSIS — I340 Nonrheumatic mitral (valve) insufficiency: Secondary | ICD-10-CM | POA: Insufficient documentation

## 2010-08-25 DIAGNOSIS — E785 Hyperlipidemia, unspecified: Secondary | ICD-10-CM | POA: Insufficient documentation

## 2010-08-25 DIAGNOSIS — G629 Polyneuropathy, unspecified: Secondary | ICD-10-CM | POA: Insufficient documentation

## 2010-08-25 NOTE — Progress Notes (Signed)
HPI Patient is seen today for followup his hospitalization.  I reviewed the hospital records.  I've also updated this EMR. He has improved greatly since the hospitalization.  At that time he was fluid overloaded.  He was diuresed carefully.  He had mild enzyme elevation.  Nuclear scan was done showing an old significant lateral infarct.  Ejection fraction on nuclear scan is listed as 26%.  Echo was done and reviewed carefully.  His ejection fraction was 35-40%.  He's been watching his salt and fluid intake carefully.  He feels much better.  He's not having chest pain.  Is not having shortness of breath.  His edema is gone.  .No Known Allergies  Current Outpatient Prescriptions  Medication Sig Dispense Refill  . aspirin 81 MG tablet Take 81 mg by mouth daily.        . brimonidine (ALPHAGAN P) 0.1 % SOLN Place 1 drop into the right eye 2 (two) times daily.        . carvedilol (COREG) 25 MG tablet Take 25 mg by mouth 2 (two) times daily with a meal.        . dabigatran (PRADAXA) 150 MG CAPS Take 150 mg by mouth every 12 (twelve) hours.        . diazepam (VALIUM) 5 MG tablet Take 2.5 mg by mouth every 12 (twelve) hours as needed.        . diltiazem (CARDIZEM CD) 180 MG 24 hr capsule Take 180 capsules by mouth Daily.      . furosemide (LASIX) 40 MG tablet Take 40 mg by mouth daily.        Marland Kitchen gabapentin (NEURONTIN) 300 MG capsule Take 300 mg by mouth 2 (two) times daily.        Marland Kitchen loratadine (CLARITIN) 10 MG tablet Take 10 mg by mouth daily.        Marland Kitchen losartan (COZAAR) 100 MG tablet Take 100 mg by mouth daily.        . metFORMIN (GLUCOPHAGE) 500 MG tablet Take 500 mg by mouth 2 (two) times daily with a meal.        . omeprazole (PRILOSEC OTC) 20 MG tablet Take 20 mg by mouth daily.        . potassium chloride SA (K-DUR,KLOR-CON) 20 MEQ tablet Take 20 mEq by mouth daily.        . simvastatin (ZOCOR) 40 MG tablet Take 40 mg by mouth at bedtime.        . Tamsulosin HCl (FLOMAX) 0.4 MG CAPS Take 0.4 mg by  mouth at bedtime.        . travoprost, benzalkonium, (TRAVATAN) 0.004 % ophthalmic solution Place 1 drop into the right eye at bedtime.        . ARTIFICIAL TEARS ophthalmic solution Place 1 drop into the left eye 2 (two) times daily.         History   Social History  . Marital Status: Married    Spouse Name: N/A    Number of Children: N/A  . Years of Education: N/A   Occupational History  . Not on file.   Social History Main Topics  . Smoking status: Former Smoker -- 1.5 packs/day for 15 years    Types: Cigarettes    Quit date: 05/01/1982  . Smokeless tobacco: Not on file  . Alcohol Use: No  . Drug Use: No  . Sexually Active: Not on file   Other Topics Concern  . Not on file  Social History Narrative  . No narrative on file    No family history on file.  Past Medical History  Diagnosis Date  . Cardiomyopathy     Ischemic  . Ejection fraction      EF 35-40%, echo, August 10, 2010  /      40-45% (better than before) Echo, January, 2010, ICD not indicated / EF 40%, echo, April, 2011  . CHF (congestive heart failure)     Systolic  . Hx of CABG     1998  . CAD (coronary artery disease)     Nuclear, August, 2005, extensive lateral scar, no ischemia /nuclear, August 09, 2010, large lateral and anterolateral scar with no ischemia, ejection fraction 26%  . Dyslipidemia   . Diabetes mellitus     Diagnosis April, 2011  . Hypertension   . GERD (gastroesophageal reflux disease)   . Anxiety   . Carotid artery disease     Dr. Edilia Bo, Doppler, August, 2011, 40-59% LICA  . CVA (cerebral infarction)     April, 2011 likely cardioembolic  . Atrial fibrillation     Atrial fibrillation/flutter, rate control, Coumadin  . Chronic anticoagulation     Pradaxa  . Peripheral neuropathy   . Leg weakness     Arterial leg Dopplers normal, 2009  . Aortic valve sclerosis     No significant stenosis, echo  . Mitral regurgitation     Mild, echo, January, 2010  . PVC's (premature  ventricular contractions)   . Positive D-dimer     D-dimer elevated, hospital, April, 2012, chest CT no pulmonary embolus    Past Surgical History  Procedure Date  . Coronary artery bypass graft 1998    ROS  Patient denies fever, chills, headache, sweats, rash, change in vision, change in hearing, chest pain, cough, nausea vomiting, urinary symptoms.  All other systems are reviewed and are negative.  PHYSICAL EXAM The patient looks quite good today.  He is oriented to person time and place.  Affect is normal.  Head is atraumatic.  There is no xanthelasma.There is no jugular venous distention.  There are no carotid bruits.  Lungs are clear.  Respiratory effort is nonlabored.  Cardiac exam reveals S1-S2.  There are no clicks or significant murmurs.  The abdomen is soft.  There is no peripheral edema.  There are no rashes.  There are no musculoskeletal deformities. Filed Vitals:   08/25/10 1252  BP: 108/67  Pulse: 49  Height: 6\' 1"  (1.854 m)  Weight: 238 lb (107.956 kg)    EKG  No EKG is done today.  ASSESSMENT & PLAN

## 2010-08-25 NOTE — Assessment & Plan Note (Signed)
Recent echo confirms that his aortic valve disease and mitral valve disease are mild.  He does not need any further workup at this time.

## 2010-08-25 NOTE — Assessment & Plan Note (Signed)
Atrial fibrillation rate in the hospital was controlled.  His meds were adjusted mildly.

## 2010-08-25 NOTE — Assessment & Plan Note (Signed)
The patient has scattered PVCs.  He has not had ventricular tachycardia.  I will watch his ejection fraction carefully over time.  I decided not proceed with ICD placement at this point.

## 2010-08-25 NOTE — Assessment & Plan Note (Signed)
Blood pressure is under good control. No change in therapy 

## 2010-08-25 NOTE — Patient Instructions (Signed)
   Follow up in 3 months as scheduled. Your physician recommends that you continue on your current medications as directed. Please refer to the Current Medication list given to you today.

## 2010-08-25 NOTE — Assessment & Plan Note (Signed)
He was definitely volume overloaded in the hospital.  He is on an excellent job of morning headache control salt and fluid intake.  He is stable at this time.  I will not adjust his medicines any further at this point.  He will have labs checked with his primary physician over the next one to 2 weeks and we will look for the results.  See him back in 3 months.

## 2010-08-25 NOTE — Assessment & Plan Note (Signed)
Patient had a positive d-dimer in the hospital.  Chest CT scan was done and it showed no pulmonary embolus.  No further workup needed.

## 2010-08-25 NOTE — Assessment & Plan Note (Signed)
He continues on this medication for his atrial fibrillation.

## 2010-09-05 ENCOUNTER — Encounter: Payer: Self-pay | Admitting: Cardiology

## 2010-09-13 NOTE — Procedures (Signed)
CAROTID DUPLEX EXAM   INDICATION:  Followup of known carotid artery disease.  Patient is  currently asymptomatic.   HISTORY:  Diabetes:  No.  Cardiac:  CABG in 1987, arrhythmia.  Hypertension:  Yes.  Smoking:  No.  Previous Surgery:  No.  CV History:  Amaurosis Fugax No, Paresthesias No, Hemiparesis No.                                       RIGHT             LEFT  Brachial systolic pressure:         110               108  Brachial Doppler waveforms:         Triphasic         Triphasic  Vertebral direction of flow:        Antegrade         Antegrade  DUPLEX VELOCITIES (cm/sec)  CCA peak systolic                   48                61  ECA peak systolic                   130               137  ICA peak systolic                   66                137  ICA end diastolic                   18                41  PLAQUE MORPHOLOGY:                  Heterogenous      Mixed  PLAQUE AMOUNT:                      Mild              Mild-moderate  PLAQUE LOCATION:                    ICA               ICA   IMPRESSION:  1. Right 1-39% internal carotid artery stenosis.  2. Left 40-59% internal carotid artery stenosis.  3. Study essentially unchanged from that on 03/19/07.   ___________________________________________  Di Kindle. Edilia Bo, M.D.   PB/MEDQ  D:  11/12/2007  T:  11/12/2007  Job:  [id number]

## 2010-09-13 NOTE — Procedures (Signed)
CAROTID DUPLEX EXAM   INDICATION:  Follow up carotid artery disease.   HISTORY:  Diabetes:  No.  Cardiac:  CABG in 1987, arrhythmia.  Hypertension:  Yes.  Smoking:  No.  Previous Surgery:  No.  CV History:  No.  Amaurosis Fugax No, Paresthesias No, Hemiparesis No.                                       RIGHT             LEFT  Brachial systolic pressure:         140               145  Brachial Doppler waveforms:         WNL               WNL  Vertebral direction of flow:        Antegrade         Antegrade  DUPLEX VELOCITIES (cm/sec)  CCA peak systolic                   56                60  ECA peak systolic                   95                97  ICA peak systolic                   68                200  ICA end diastolic                   21                50  PLAQUE MORPHOLOGY:                  Heterogenous      Heterogenous  PLAQUE AMOUNT:                      Mild              Moderate  PLAQUE LOCATION:                    Bulb              ICA   IMPRESSION:  1. Right internal carotid artery suggests 1% to 39% stenosis.  2. Left internal carotid artery suggests 40% to 59% stenosis.  3. Antegrade flow in bilateral vertebrals.   ___________________________________________  Di Kindle. Edilia Bo, M.D.   CB/MEDQ  D:  12/15/2009  T:  12/15/2009  Job:  [id number]

## 2010-09-13 NOTE — Assessment & Plan Note (Signed)
[facility]                          [facility] OFFICE NOTE   JOSEF, TOURIGNY                       MRN:          [id number]  DATE:07/12/2007                            DOB:          Jun 22, 1931    Mr. Vasudevan is seen for followup.  I saw him on June 03, 2007.  Since  that time he has had a 2-D echo.  His ejection fraction is in the 30-35%  range.  With this in mind, we will have to be more active concerning the  possibility of whether or not he will need an ICD.   The first change will be to change his metoprolol to carvedilol.  I have  switched him to 12.5 p.o. of carvedilol b.i.d. today.  I explained to  him that I will follow him frequently over time, and that we will  consider a followup echo or MUGA in the future to make a final decision  about other approaches to his treatment.  He is very active.  He does  have some type of neuropathy.  However, he works every day and being  aggressive with him is appropriate.   PAST MEDICAL HISTORY/ALLERGIES:  No known drug allergies.   MEDICATIONS:  1. Lisinopril 40.  2. Simvastatin 40.  3. Amlodipine 5.  4. EpiPen 300 daily.  5. Flomax.  6. Aspirin.  7. Omeprazole.  8. Metoprolol (being switched to carvedilol).   REVIEW OF SYSTEMS:  He is feeling well other than the HPI.   PHYSICAL EXAM:  Blood pressure is 129/81 with a pulse of 81.  The patient is oriented to person, time and place.  Affect is normal.  HEENT:  Reveals no xanthelasma.  He has normal extraocular motion.  There are no carotid bruits.  There is no jugular venous distention.  LUNGS:  Clear.  Respiratory effort is not labored.  CARDIAC EXAM:  Reveals an S1 with an S2.  There are no clicks or  significant murmurs.  ABDOMEN:  Benign and soft.  He has no peripheral edema.   PROBLEMS:  1-7 are listed on the note of June 03, 2007.  1. Left ventricular dysfunction.  See the discussion and plan above.      We will switch him  to      carvedilol and slowly increase his dose, and then make longer term      decisions about his final dosing and the possibility of an ICD.     Luis Abed, MD, [facility]  Electronically Signed    JDK/MedQ  DD: 07/12/2007  DT: 07/13/2007  Job #: [id number]   cc:   Donzetta Sprung

## 2010-09-13 NOTE — Assessment & Plan Note (Signed)
[facility]                          [facility]ICE NOTE   VANESSA, ALESI                       MRN:          [id number]  DATE:09/05/2007                            DOB:          1931-11-18    CARDIOLOGIST:  Dr. Willa Rough.   PRIMARY CARE PHYSICIAN:  Dr. Donzetta Sprung.   REASON FOR VISIT:  43-month followup.   HISTORY OF PRESENT ILLNESS:  Mr. Scheaffer is a 77 year old male patient  with a history of ischemic cardiomyopathy with an EF of 30-35%.  Dr.  Myrtis Ser has been seeing him over the last several months up-titrating his  medications for chronic systolic congestive heart failure.  When last  seen, his carvedilol was increased to 25 mg b.i.d.  In the office today,  the patient notes he is doing well.  He denies any significant shortness  of breath.  He describes NYHA class II symptoms.  He denies orthopnea,  PND, pedal edema.  He denies any chest discomfort.  He denies any  syncope, near-syncope, or palpitations.   CURRENT MEDICATIONS:  1. Lisinopril 40 mg day.  2. Simvastatin 40 mg daily.  3. Amlodipine 5 mg daily.  4. Gabapentin 300 mg daily.  5. Hydrochlorothiazide 25 mg half tablet daily.  6. Flomax 0.4 mg daily.  7. Aspirin 81 mg daily.  8. Omeprazole 20 mg daily.  9. Diazepam 5 mg half tablet b.i.d.  10.Carvedilol 25 mg b.i.d.   PHYSICAL EXAM:  He is a well-nourished well-developed male in no acute  distress.  Blood pressure is 114/62, pulse 54, weight 250.8 pounds.  HEENT is normal.  Neck without JVD.  CARDIAC:  Normal S1, S2.  Regular rate and rhythm without murmur.  LUNGS:  Clear to auscultation bilaterally, without wheezing, rhonchi or  rales.  ABDOMEN:  Soft, nontender with normoactive bowel sounds.  No  organomegaly.  EXTREMITIES:  With trace to 1+ ankle edema bilaterally.  NEUROLOGIC:  He is alert and oriented x3.  Cranial nerves 2-12 grossly  intact.   IMPRESSION:  1. Ischemic cardiomyopathy with an EF of  30-35%.  2. Chronic systolic congestive heart failure.  NYHA class II symptoms.  3. Coronary disease status post CABG in 1998.  Last nuclear study      August 2005 with inferior scar and no ischemia.  4. Treated dyslipidemia.  5. Hypertension.  6. Gastroesophageal reflux disease.  7. History of anxiety.  8. History of carotid stenosis followed by Dr. Cari Caraway.  9. Peripheral neuropathy.   PLAN:  1. The patient's medications have been titrated to their maximal      dosages.  He technically could take 50 mg of carvedilol twice a day      as he is greater than 85 kg.  However, his pulse would probably not      allow that.  Also, his pulse would not allow initiation of digoxin.      He is NYHA class II and does not need spironolactone.  2. After further discussion with Dr. Myrtis Ser, we have decided to set the      patient  up for an echocardiogram to reassess his LV function.  We      will see him back in followup after his echocardiogram and make a      decision regarding plus or minus EP referral.      Tereso Newcomer, PA-C  Electronically Signed      Luis Abed, MD, [facility]  Electronically Signed   SW/MedQ  DD: 09/05/2007  DT: 09/05/2007  Job #: [id number]   cc:   Donzetta Sprung

## 2010-09-13 NOTE — Assessment & Plan Note (Signed)
[facility]                          [facility]ICE NOTE   MANUELITO, POAGE                       MRN:          [id number]  DATE:03/30/2008                            DOB:          1931-12-28    Mr. Essman is stable.  He is getting medications for his LV dysfunction.  I saw him last in July 2009.  It is not clear today whether or not he is  taking his carvedilol twice a day or not.  With this in mind, we must  check this to be sure.  We will want to do a followup 2-D echo at some  point to see if he has had improvement in his LV function on optimal  medications.  This, of course, would include carvedilol 25 b.i.d..  He  is not having any signs of heart failure.  He does have difficulty with  his legs.  He has been told that he has no neuropathy.  I cannot find  that we have absolutely documented his leg Dopplers and we will do this.   ALLERGIES:  No known drug allergies.   MEDICATIONS:  1. Lisinopril 40.  2. Amlodipine 5.  3. Carvedilol 25 (we will check to be sure twice a day).  4. Omeprazole 20.  5. Hydrochlorothiazide.  6. Simvastatin.  7. Gabapentin.  8. Flomax.  9. Aspirin.  10.Mentax cream.  11.Diazepam.   OTHER MEDICAL PROBLEMS:  See the complete list on my note of November 05, 2007.   REVIEW OF SYSTEMS:  Other than difficulty with his legs, his review of  systems is negative.   PHYSICAL EXAMINATION:  VITAL SIGNS:  Blood pressure is 126/70.  The  pulse is 51.  Weight is 261.  GENERAL:  The patient is oriented to person, time, and place.  Affect is  normal.  HEENT:  No xanthelasma.  He has normal extraocular motion.  NECK:  There are no carotid bruits.  There is no jugular venous  distention.  LUNGS:  Clear.  Respiratory effort is not labored.  CARDIAC:  S1 and S2.  There are no clicks or significant murmurs.  ABDOMEN:  Soft.  EXTREMITIES:  He has no significant peripheral edema.   Problems are listed on the note of  November 05, 2007.  We will document his  carvedilol dose.  I will then decide about that whether it will be time  to reassess his left ventricular function.  His  coronary artery disease is stable.  We still have questions about his  leg problems.  He will have arterial Dopplers and I will see him for  followup.     Luis Abed, MD, [facility]  Electronically Signed    JDK/MedQ  DD: 03/30/2008  DT: 03/31/2008  Job #: [id number]

## 2010-09-13 NOTE — Assessment & Plan Note (Signed)
[facility]                          [facility]   Walter, DEWOLFE                       MRN:          [id number]  DATE:06/03/2007                            DOB:          1932/04/12    Walter [name] is here for cardiology follow-up.  I saw him last in February  of 2007.  He has done well.  He follows carefully with Dr. Reuel [name].  Unfortunately, he has had some type of neuropathy that affects his legs,  and this effects his overall ability to get around.  He is bothered by  this, but he continues to move forward.  He has not had any chest pain  or significant shortness of breath.  He has had no syncope or  presyncope.  He has a history of CABG in 1988.  He had nuclear scans in  2003 and 2005 with no significant ischemia.  However, he does have a  decreased ejection fraction.  His last echo was done in February of  2007, and at that time, his ejection fraction was 35-40% with inferior  posterior akinesis and lateral hypokinesis.  He also had grade 2  diastolic dysfunction at that time.   PAST MEDICAL HISTORY:   ALLERGIES:  NO KNOWN DRUG ALLERGIES.   MEDICATIONS:  At this time, he is on:  1. Metoprolol 50 in the morning, 25 in the evening.  2. Lisinopril 40.  3. Simvastatin 40.  4. Diazepam.  5. Amlodipine 5.  6. Omeprazole 200.  7. Gabapentin 300 b.i.d.  8. Chlorthalidone 12.5.  9. Flomax 0.4.  10.Aspirin 81.   OTHER MEDICAL PROBLEMS:  See the list below.   REVIEW OF SYSTEMS:  As mentioned, he is bothered by his neuropathy.  Otherwise, his review of systems is negative.   PHYSICAL EXAMINATION:  Weight today is 257 pounds.  This is increased  from 254 pounds.  The blood pressure today is 130/80.  His pulse is in  the 60 range.  The patient is definitely overweight.  HEENT:  Reveals no xanthelasma.  He has normal extraocular motion.  There are no carotid bruits today.  There is no jugular venous  distention.  LUNGS:  Are  clear.  Respiratory effort is not labored.  CARDIAC EXAM:  Reveals a S1 with a S2.  There are no clicks or  significant murmurs.  ABDOMEN:  Is obese but soft.  He has no masses or bruits.  There is no peripheral edema.  Distal pulses are good.  He has no major  musculoskeletal deformities.   EKG was difficult to obtain, but ultimately we were able to.  He has  atrial bigeminy.  There are no major new QRS changes.  He has relative  sinus bradycardia.   PROBLEMS INCLUDE:  1. History of some mild anxiety in the past that has been stable.  2. History of premature ventricular contractions.  3. Status post cholecystectomy.  4. History of hypertension, treated.  5. Gastroesophageal reflux disease, treated.  6. Hyperlipidemia.  He is followed by Dr. Reuel [name] carefully.  He is on  simvastatin.  7. History of myalgias from Zocor in the past although he is      tolerating simvastatin now.  8. Left ventricular dysfunction.  His last echo with ejection fraction      35-40% in 2007.  I have not yet switched him to Coreg.  I spoke      with him today about the approach of rechecking his LV function and      then deciding if we have to consider evaluation for other issues      related to his heart rhythm.  I have not yet specifically raised      the question of a possible ICD.  I will see him back after his      echo.  9. Coronary disease post coronary artery bypass grafting in 1998.  He      does not need an exercise test at this time.  10.History of vision in his left eye several years ago.  11.Carotid bruits.  Historically, these are followed by Dr. Cari [name], and he has seen Dr. Edilia [name] within the past year.   We will do a 2-D echocardiogram, and then I will see Walter [name] back.  We will then decide about any further medications changes and any other  consideration of any other devices because of his LV dysfunction.     Walter Abed, MD, [facility]  Electronically Signed     JDK/MedQ  DD: 06/03/2007  DT: 06/03/2007  Job #: [id number]   cc:   Walter [name]. Walter [name], M.D.

## 2010-09-13 NOTE — Consult Note (Signed)
NEW PATIENT CONSULTATION   [name], [name]  DOB:  09/25/1931                                       12/15/2009  EAVWU#:[id number]   I saw patient in the office today for continued follow-up of his carotid  disease.  This is a pleasant 77 year old left-handed gentleman who we  have been obtaining yearly duplex scans to follow a moderate left  carotid stenosis.  His last duplex was in July, 2010.  He had a 40% to  59% left carotid stenosis with a less than 39% right carotid stenosis.  I last saw him back in November 2007.  He was asymptomatic, and we were  simply following this on a yearly basis.   Since I saw him last, he states that about 5 months ago, he developed an  episode where he had some transient expressive aphasia and paresthesias  in the left arm.  He was hospitalized and underwent an extensive workup,  and it was felt that this was most likely a cardioembolic stroke.  He  has a history of atrial flutter and atrial fibrillation.  He is followed  by Dr. Myrtis Ser.  He was started on Pradaxa and has had no further problems.  Of note, he has had no previous history of stroke, TIAs, expressive or  receptive aphasia or amaurosis fugax.   PAST MEDICAL HISTORY:  1. Significant for new onset diabetes, which was diagnosed during this      last admission in April.  2. He also has a history of coronary artery disease and has undergone      previous coronary bypass surgery.  3. He has a history of an ischemic cardiomyopathy with an ejection      fraction of 40%.  4. Neuropathy.  5. Dyslipidemia.  6. Degenerative arthritis.  7. Hypertension.   SOCIAL HISTORY:  He is married, and he has 2 children.  He quit tobacco  20 years ago.   FAMILY HISTORY:  He had a brother who had a heart attack at age 109.  His  father had a heart attack at age 68.  He has a sister who also has  coronary artery disease.   REVIEW OF SYSTEMS:  GENERAL:  He has had no recent weight loss,  weight  gain, or problems with his appetite.  CARDIOVASCULAR:  He has had no recent chest pain, chest pressure, or  palpitations.  He does have a history of atrial fibrillation.  He has  had no history of DVT.  GI:  He has a history of gastroesophageal reflux disease.  NEUROLOGIC:  He has had no dizziness, blackouts, headaches or seizures.  ENT:  He has had some decreased hearing recently.  MUSCULOSKELETAL:  He does have a history of arthritis.  Neurologic, pulmonary, hematologic, GU, psychiatric and integumentary  review of systems is unremarkable and is documented on the medical  history form in his chart.   PHYSICAL EXAMINATION:  This is a pleasant 77 year old gentleman who  appears his stated age.  Blood pressure is 145/80, heart rate is 64,  saturation 99%.  HEENT is unremarkable.  Lungs are clear bilaterally to  auscultation without rales, rhonchi or wheezing.  On cardiovascular  exam, I do not detect any carotid bruits.  He has a regular rhythm.  He  has palpable femoral pulses and warm, well-perfused feet.  He  has  hyperpigmentation bilaterally with mild bilateral lower extremity  swelling.  Abdomen is soft and nontender with normal-pitched bowel  sounds.  Musculoskeletal exam has no major deformities or cyanosis.  Neurologic:  He has no focal weakness or paresthesias.  Skin:  He does  have evidence of chronic venous insufficiency bilaterally with  hyperpigmentation.   I have independently interpreted his carotid duplex scan which shows no  significant carotid stenosis on the right.  On the left side he has a  40% to 59% stenosis.  The velocities have not changed over the last  year.  Both vertebral arteries are patent with normally directed flow,  and arm pressures were essentially equal.   I have ordered a follow-up carotid duplex scan in 1 year.  I will see  him back at that time.  He knows to call sooner if he has problems.  In  the meantime, he will remain on his  current medications.  He is followed  closely by Dr. Donzetta Sprung and Jerral Bonito.     Di Kindle. Edilia Bo, M.D.  Electronically Signed   CSD/MEDQ  D:  12/15/2009  T:  12/15/2009  Job:  [id number]   cc:   Orlean Bradford, MD, [facility]

## 2010-09-13 NOTE — Assessment & Plan Note (Signed)
[facility]                          [facility]   KEEGEN, HEFFERN                       MRN:          [id number]  DATE:11/05/2007                            DOB:          1931/09/05    Mr. Walter [name] is doing well.  He is not having any chest pain.  He is  tolerating his medications for his left ventricular dysfunction.  Since  his last visit we arranged for a 2D echo.  His ejection fraction is  estimated in the 35-40% range.  This is better than we had seen it in  the prior echo and hopefully this is related to his medication changes.  We will not up titrate his medicines any further but will give him more  time to see if there is further improvement in his left ventricular  dysfunction.  He is feeling well and more active than ever.   He also mentions that the discomfort that he has had in his feet is  greatly improved.  He was put on Metanx.  He says that he felt  remarkably better over several days and is doing well.  He says that  this helps his feet and his legs and his hips.  He does not actually  describe exertional claudication.  However, his podiatrist has  questioned whether he needs vascular studies and I will leave this up to  Dr. Cari Caraway, who the patient will be seeing soon for the followup  of his carotids.   The patient is not having any PND or orthopnea.  He is not having any  marked edema.  He has had no syncope or presyncope.   PAST MEDICAL HISTORY:   ALLERGIES:  No known drug allergies.   MEDICATIONS:  Lisinopril 40, simvastatin 40, amlodipine 5,  hydrochlorothiazide 12.5, Flomax, aspirin 81, omeprazole, diazepam,  carvedilol 25 b.i.d., Gabapentin 300 b.i.d. and Metanx.   REVIEW OF SYSTEMS:  He is feeling great.  He has no significant  complaints today and he is feeling the best he has in many years.   His review of systems is negative.   PHYSICAL EXAM:  Weight is 254 pounds.  Blood pressure is  128/77 with a  pulse of 54.  The patient is oriented to person, time and place.  Affect is normal.  He is here with his wife today.  HEENT:  Reveals no xanthelasma.  He has normal extraocular motion.  There are no carotid bruits.  There is no jugular venous distention.  LUNGS:  Clear.  Respiratory effort is not labored.  CARDIAC EXAM:  Reveals an S1 and S2.  There are no clicks or significant  murmurs.  ABDOMEN:  Obese but soft.  He has no significant peripheral edema.  There are no musculoskeletal deformities.   LABORATORY DATA:  As noted above reveal that he had a 2D echo on October 23, 2007 with an ejection fraction read in the 35-40% range with global  hypokinesis with some regional variation.  He has concentric left  ventricular hypertrophy.   PROBLEMS:  1. Ischemic cardiomyopathy.  His ejection  fraction now is in the 35-      40% range.  With this in mind, I will give him more time to see how      his LV responds.  Over time we will decide if a MUGA should be done      and if an ICD should be considered.  2. History of chronic systolic heart failure although his symptoms at      this time are New York Heart Association class II at the most.  3. Coronary disease post coronary artery bypass graft in 1998.  Last      nuclear study was in August of 2005 with inferior scar but no      ischemia.  4. Dyslipidemia, treated.  5. Hypertension, treated.  6. Gastroesophageal reflux disease.  7. History of some anxiety.  8. History of carotid stenoses, followed by Dr. Cari Caraway.  9. History of peripheral neuropathy which appears to be stable.  10.Question of peripheral vascular disease in his legs.  As mentioned,      he will talk to Dr. Edilia Bo and this note will mention to Dr.      Edilia Bo whether or not peripheral arterial Dopplers of the leg      should be done.  11.Mild aortic leaflet calcification but no significant aortic      stenosis.  12.History of premature ventricular  contractions, stable.   I will see him back for cardiology followup in 3 months.     Luis Abed, MD, [facility]  Electronically Signed    JDK/MedQ  DD: 11/05/2007  DT: 11/05/2007  Job #: [id number]   cc:   Holland Falling. Edilia Bo, M.D.

## 2010-09-13 NOTE — Procedures (Signed)
CAROTID DUPLEX EXAM   INDICATION:  Follow-up carotid artery disease.   HISTORY:  Diabetes:  No.  Cardiac:  CABG 1987, arrhythmia.  Hypertension:  Yes.  Smoking:  No.  Previous Surgery:  No.  CV History:  No.  Amaurosis Fugax No, Paresthesias No, Hemiparesis No.                                       RIGHT             LEFT  Brachial systolic pressure:         136               130  Brachial Doppler waveforms:         WNL               WNL  Vertebral direction of flow:        Antegrade         Antegrade  DUPLEX VELOCITIES (cm/sec)  CCA peak systolic                   66                69  ECA peak systolic                   133               137  ICA peak systolic                   69                192  ICA end diastolic                   20                52  PLAQUE MORPHOLOGY:                  Heterogeneous     Mixed  PLAQUE AMOUNT:                      Mild              Moderate  PLAQUE LOCATION:                    ICA/bifurcation   ICA/bifurcation   IMPRESSION:  1. Right ICA shows evidence of 1% to 39% stenosis.  2. Left ICA shows evidence of 40% to 59% stenosis.  3. No significant changes from previous study.   ___________________________________________  Di Kindle. Edilia Bo, M.D.   AS/MEDQ  D:  11/16/2008  T:  11/16/2008  Job:  [id number]

## 2010-09-13 NOTE — Procedures (Signed)
CAROTID DUPLEX EXAM   INDICATION:  Followup evaluation of known carotid artery disease.  Previous study revealed a 1% to 39% right ICA stenosis and a 40% to 59%  left ICA stenosis.   HISTORY:  Diabetes:  No.  Cardiac:  Coronary artery bypass graft in 1987  Hypertension:  Yes.  Smoking:  Quit in 1987.  Previous Surgery:  No.  CV History:  Patient reports no cerebrovascular symptoms at this time.  Amaurosis Fugax No, Paresthesias No, Hemiparesis No.                                       RIGHT             LEFT  Brachial systolic pressure:         150               150  Brachial Doppler waveforms:         Triphasic         Triphasic  Vertebral direction of flow:        Antegrade         Antegrade  DUPLEX VELOCITIES (cm/sec)  CCA peak systolic                   56                54  ECA peak systolic                   116               95  ICA peak systolic                   67                133  ICA end diastolic                   21                35  PLAQUE MORPHOLOGY:                  Soft              Mixed  PLAQUE AMOUNT:                      Mild              Mild  PLAQUE LOCATION:                    Proximal ICA      Proximal ICA   IMPRESSION:  1. A 20% to 39% right ICA stenosis.  2. A 40% to 59% left ICA stenosis.  3. No significant change since previous study performed March 14, 2006.   ___________________________________________  Di Kindle. Edilia Bo, M.D.   MC/MEDQ  D:  03/19/2007  T:  03/20/2007  Job:  [id number]

## 2010-09-13 NOTE — Assessment & Plan Note (Signed)
[facility]                          [facility] NOTE   RANDEE, UPCHURCH                       MRN:          [id number]  DATE:05/08/2008                            DOB:          [birth year]    Mr. Fink is here for followup.  When I saw him last on March 30, 2008, we redocumented that he was on carvedilol 25 b.i.d.  He has now  been on medications for his LV dysfunction for an extended period of  time.  We had done an echo in June 2009.  It is time for a followup.  There will be very careful assessment of this and decision about to  whether to talk further about an ICD.   The patient is not having any chest pain.  His major limiting factor is  discomfort and problems with his legs.  We did ABIs, and his ABIs are  normal in his legs.  His difficulty with his legs is not vascular in  origin.   He is not having any chest pain.  He has some tingling in his left arm  which does not sound cardiac.   ALLERGIES:  No known drug allergies.   MEDICATIONS:  1. Lisinopril 40.  2. Amlodipine 5.  3. Omeprazole 20.  4. Hydrochlorothiazide 25.  5. Simvastatin 40.  6. Gabapentin 300 b.i.d.  7. Flomax.  8. Aspirin.  9. Diazepam.  10.Carvedilol 25 b.i.d.   OTHER MEDICAL PROBLEMS:  See the complete list on my note of November 05, 2007.   REVIEW OF SYSTEMS:  He is not having any GI or GU symptoms.  He is not  having any headaches.  There are no fevers or chills.  There are no skin  rashes.  Otherwise, his review of systems is negative.   PHYSICAL EXAMINATION:  Blood pressure is 124/73 with a pulse of 54.  Weight is 257 pounds.  The patient is oriented to person, time, and  place.  Affect reveals that he is upset today.  He has found that he has  a copay that he has to pay that was not present in the past.  He is  upset with the insurance company for not notifying him.  We talked about  this.  He also was concerned when I not immediately have  his recent leg  ABIs available.  It was actually in front of the chart and I had not yet  read it.  It has already been screened and reviewed by our team.  The  patient and I know each other for a long period of time and he is fine.  HEENT reveals no xanthelasma.  He has normal extraocular motion.  There  are no carotid bruits.  There is no jugular venous distention.  Lungs  are clear.  Respiratory effort is not labored.  Cardiac exam reveals an  S1 with an S2.  There are no clicks or significant murmurs.  His abdomen  is soft.  He has no peripheral edema.   The ABIs are documented below.   Problems are listed on  the note of November 05, 2007.  1. Ischemic cardiomyopathy.  We will reduce his 2-D echo.  I will      review it carefully.  I had a very long discussion with him about      the pros and cons of an ICD.  I am hoping that his ejection      fraction will be 40% or higher and that we can answer this question      once and for all.  2. History of chronic systolic heart failure that is quite stable.  3. Coronary disease post CABG in 1998 with a nuclear scan in 2005 with      no ischemia.  4. Dyslipidemia.  5. Hypertension.  6. GERD.  7. History of some anxiety.  8. Carotid stenosis followed by Dr. Waverly Ferrari.  Dr. Edilia Bo      did carotid Dopplers in July 2009.  He has 39% right internal      carotid stenosis and 40-59% left internal carotid artery stenosis.      This is stable.  9. History of peripheral neuropathy affecting his legs.  He has normal      ABIs in his legs.  10.Mild aortic leaflet calcification, but no significant stenosis.  11.History of premature ventricular contractions.   The patient will have a 2-D echo.  I will personally review it.  I will  then be in touch with the patient, and he will have a follow-up visit to  discuss it further in person.     Luis Abed, MD, [facility]  Electronically Signed    JDK/MedQ  DD: 05/08/2008  DT: 05/09/2008  Job  #: [id number]

## 2010-09-13 NOTE — Assessment & Plan Note (Signed)
[facility]                          [facility]   ALLANTE, WHITMIRE                       MRN:          [id number]  DATE:08/02/2007                            DOB:          11-Feb-1932    Mr. Yarbrough is seen for follow-up.  As outlined before in my prior notes,  he has left ventricular dysfunction.  We are titrating his medications.  I had changed him to carvedilol and he is tolerating 12.5 mg b.i.d.  He  is not having chest pain or shortness of breath.   PAST MEDICAL HISTORY:  Allergies:  No known drug allergies.   Medications:  1. Lisinopril 40 mg.  2. Simvastatin 40 mg.  3. Amlodipine 5 mg.  4. Gabapentin 300 mg.  5. Hydrochlorothiazide.  6. Flomax.  7. Aspirin.  8. Omeprazole.  9. Diazepam.  10.Carvedilol 12.5 mg b.i.d. (to be increased to 25 mg b.i.d.).   Other medical problems:  See the list below.   REVIEW OF SYSTEMS:  He feels great other than his legs.  I wish I could  offer him more in this regard.  Otherwise, the review of systems is  negative.   PHYSICAL EXAM:  Blood pressure is 130/72 with a pulse of 54.  The patient is oriented to person, time and place.  Affect is normal.  HEENT:  No xanthelasma.  He has normal extraocular motion.  There are no carotid bruits.  There is no jugular venous distention.  LUNGS:  Clear.  Respiratory effort is not labored.  CARDIAC:  An S1 with an S2.  There are no clicks or significant murmurs.  ABDOMEN:  Soft.  He has no masses or bruits.   Despite his relatively low heart rate, his blood pressure is stable and  we will push his carvedilol higher to 25 mg b.i.d., and I will see him  back in 3-4 weeks.     Luis Abed, MD, [facility]  Electronically Signed    JDK/MedQ  DD: 08/02/2007  DT: 08/02/2007  Job #: [id number]

## 2010-09-16 NOTE — Assessment & Plan Note (Signed)
[facility]                          [facility]   DAT, DERKSEN                       MRN:          [id number]  DATE:05/20/2008                            DOB:          1932/04/17    I had seen Walter [name] last on May 08, 2008.  We decided to proceed  with a followup 2-D echo.  That study has been done, and I have reviewed  it today.  I certainly agree that his ejection fraction is 40% or  higher.  With this in mind, we will not need to consider an ICD.   I called Walter [name] and explained this to him.  He and I decided on the  phone that we did not need to speak in person since the result was so  good.  I will plan to see him back for cardiology followup in 6 months.     Luis Abed, MD, [facility]  Electronically Signed    JDK/MedQ  DD: 05/20/2008  DT: 05/20/2008  Job #: [id number]   cc:   Donzetta Sprung

## 2010-09-16 NOTE — Op Note (Signed)
NAME:  Walter [name], Walter [name] NO.:  [id number]   MEDICAL RECORD NO.:  [id number]                   PATIENT TYPE:  OIB   LOCATION:  2876                                 FACILITY:  [facility]   PHYSICIAN:  Herby Abraham., M.D.             DATE OF BIRTH:  November 29, 1931   DATE OF PROCEDURE:  05/13/2002  DATE OF DISCHARGE:  05/12/2002                                 OPERATIVE REPORT   PREOPERATIVE DIAGNOSIS:  Neovascular glaucoma, left eye.   POSTOPERATIVE DIAGNOSIS:  Neovascular glaucoma, left eye.   PROCEDURE:  Baerveldt glaucoma implant with Tutoplast tissue graft, left  eye.   SURGEON:  Melvenia Needles, M.D.   ANESTHESIA:  Local with standby.   INDICATION FOR PROCEDURE:  The patient is a 77 year old man with advanced  open-angle glaucoma in his left eye.  He has developed neovascular glaucoma  within the last month with uncontrolled intraocular pressures despite full  medical therapy.  The risks, benefits, and alternative therapies were  discussed with the patient.  He decided to proceed with Baerveldt glaucoma  implant with Tutoplast tissue graft, left eye.   INDICATIONS FOR OUTPATIENT SURGERY:  Inpatient setting not required.   NEED FOR OVERNIGHT STAY:  Not needed.   DESCRIPTION OF PROCEDURE:  The patient was brought to operating room 2,  where he was carefully positioned on the operating room table.  The skin  about the left eye and left side of the face was prepped and draped as a  sterile field after he had received IV sedation and Dr. Eulah Pont had  performed a left retrobulbar injection.  After draping the eye as a sterile  field, the lid speculum was inserted.  A 6-0 black silk suture was passed  through the peripheral cornea and fixed through the drape as a retraction  suture.  This suture was partial-thickness.  An incision was made in the  superior temporal quadrant between the insertion of the superior rectus and  lateral rectus muscle tendons.   This incision was carried down to bare  sclera and extended concentric with the limbus to the 1 and 2:30 positions.  Dissection was carried posteriorly through the incision between the  insertion of the muscles.  Each muscle was then isolated on a muscle hook.  The Baerveldt implant was brought onto the sterile field and found to  irrigate freely.  A 4-0 silk monofilament suture was passed through the  lumen of the tube from the implant end.  The implant was then placed beneath  the superior rectus, which was isolated on a muscle hook.  The lateral wing  of the implant was then placed beneath the lateral rectus muscle tendon.  The implant was fixed to the eye with two 8-0 nylon sutures 12 mm posterior  to the limbus.  The knots were turned into the eyelets.  Dissection was then  carried forward from the original incision  toward the limbus.  The limbus  was cleaned off with the Westcott scissors.  A paracentesis track was made  at the inferotemporal limbus to allow egress of a drop of aqueous.  Returning to the limbus in the superior temporal quadrant beneath the flap,  the anterior chamber was entered with a 22-gauge needle on a syringe of  viscoelastic.  The needle entered the eye in the plane of the iris.  Viscoelastic was injected.  The tube of the Baerveldt implant was trimmed  and passed into the anterior chamber.  The tube was then withdrawn and the  length shortened, and once again the tube was placed in the anterior  chamber.  The tube was then fixed to the sclera with a single 8-0 nylon  suture.  The 15 degree Supersharp blade was used to create two slits in the  tube.  Two 7-0 Vicryl sutures were then passed around the tube and tied  tightly to occlude the tube with the internal 4-0 nylon suture.  A piece of  Tutoplast was doubled over and placed over the tube from its entrance site  at the limbus to the front of the implant.  This was fixed to the sclera  with four interrupted  7-0 Vicryl sutures.  The conjunctival Tenon's incision  was then repaired with a running suture of 7-0 Vicryl with every other pass  of the needle being locked.  During the final stages of the procedure, two  drops of scopolamine 0.25% and two drops of Ocuflox were applied to the eye  every four minutes for a total of five applications.  At the conclusion of  the procedure, the viscoelastic was irrigated out of the anterior chamber  through the paracentesis site, and Decadron 0.5 cubic centimeters was  injected subconjuctivally in the lower cul-de-sac.  The lid speculum was  removed.  The surgical drape was removed.  The eye was dressed with  scopolamine and Ocuflox with a light eye pad and an eye shield.  The patient  was transported to the recovery area in good condition.  He will be followed  up in my office at 8:30 a.m. on 05/13/02.                                               Herby Abraham., M.D.    LC/MEDQ  D:  05/13/2002  T:  05/13/2002  Job:  [id number]

## 2010-11-08 ENCOUNTER — Encounter: Payer: Self-pay | Admitting: Cardiology

## 2010-11-11 ENCOUNTER — Ambulatory Visit (INDEPENDENT_AMBULATORY_CARE_PROVIDER_SITE_OTHER): Payer: Medicare Other | Admitting: Cardiology

## 2010-11-11 ENCOUNTER — Encounter: Payer: Self-pay | Admitting: Cardiology

## 2010-11-11 DIAGNOSIS — I428 Other cardiomyopathies: Secondary | ICD-10-CM

## 2010-11-11 DIAGNOSIS — I509 Heart failure, unspecified: Secondary | ICD-10-CM

## 2010-11-11 DIAGNOSIS — I4891 Unspecified atrial fibrillation: Secondary | ICD-10-CM

## 2010-11-11 DIAGNOSIS — I2510 Atherosclerotic heart disease of native coronary artery without angina pectoris: Secondary | ICD-10-CM

## 2010-11-11 NOTE — Assessment & Plan Note (Signed)
His lites status is stable. No change in therapy. 

## 2010-11-11 NOTE — Assessment & Plan Note (Signed)
With the patient's cardiomyopathy he'll apply to have him off diltiazem.  He had atrial fibrillation but now has sinus bradycardia.  His pressure is on the low side and he is having mild orthostatic symptoms.  Diltiazem will be stopped.  Other medications will be continued.

## 2010-11-11 NOTE — Assessment & Plan Note (Signed)
Today he has sinus rhythm with sinus bradycardia.  This does not change the need for Coumadin.  I am stopping his diltiazem.

## 2010-11-11 NOTE — Assessment & Plan Note (Signed)
Coronary disease is stable.  No change in therapy.  We'll see him back in 3 months to assess his overall status with the medicine changes made today.

## 2010-11-11 NOTE — Patient Instructions (Addendum)
Follow up as scheduled. Your physician recommends that you continue on your current medications as directed. Please refer to the Current Medication list given to you today. Stop Diltiazem

## 2010-11-11 NOTE — Progress Notes (Signed)
HPI The patient is seen for cardiology followup.  I saw him last April at 26, 2012.  He is doing well.  He is not having chest pain or shortness of breath.  He has had some mild dizziness when standing.  He has not had true syncope.  I saw him last he was posthospitalization.  We have been adjusting his meds for his left ventricular function and fluid overload.  He had atrial fib in the hospital.  His rate was controlled at that time. No Known Allergies  Current Outpatient Prescriptions  Medication Sig Dispense Refill  . ARTIFICIAL TEARS ophthalmic solution Place 1 drop into the left eye 2 (two) times daily.       Marland Kitchen aspirin 81 MG tablet Take 81 mg by mouth daily.        . brimonidine (ALPHAGAN P) 0.1 % SOLN Place 1 drop into the right eye 2 (two) times daily.        . carvedilol (COREG) 25 MG tablet Take 25 mg by mouth 2 (two) times daily with a meal.        . dabigatran (PRADAXA) 150 MG CAPS Take 150 mg by mouth every 12 (twelve) hours.        . diazepam (VALIUM) 5 MG tablet Take 2.5 mg by mouth every 12 (twelve) hours as needed.        . diltiazem (CARDIZEM CD) 180 MG 24 hr capsule Take 180 capsules by mouth Daily.      . furosemide (LASIX) 40 MG tablet Take 40 mg by mouth daily.        Marland Kitchen gabapentin (NEURONTIN) 300 MG capsule Take 300 mg by mouth 2 (two) times daily.        Marland Kitchen loratadine (CLARITIN) 10 MG tablet Take 10 mg by mouth daily.        Marland Kitchen losartan (COZAAR) 100 MG tablet Take 100 mg by mouth daily.        . metFORMIN (GLUCOPHAGE) 500 MG tablet Take 500 mg by mouth 2 (two) times daily with a meal.        . omeprazole (PRILOSEC OTC) 20 MG tablet Take 20 mg by mouth daily.        . potassium chloride SA (K-DUR,KLOR-CON) 20 MEQ tablet Take 20 mEq by mouth daily.        . simvastatin (ZOCOR) 40 MG tablet Take 40 mg by mouth at bedtime.        . Tamsulosin HCl (FLOMAX) 0.4 MG CAPS Take 0.4 mg by mouth at bedtime.        . travoprost, benzalkonium, (TRAVATAN) 0.004 % ophthalmic solution Place  1 drop into the right eye at bedtime.          History   Social History  . Marital Status: Married    Spouse Name: N/A    Number of Children: N/A  . Years of Education: N/A   Occupational History  . Not on file.   Social History Main Topics  . Smoking status: Former Smoker -- 1.5 packs/day for 15 years    Types: Cigarettes    Quit date: 05/01/1982  . Smokeless tobacco: Never Used  . Alcohol Use: No  . Drug Use: No  . Sexually Active: Not on file   Other Topics Concern  . Not on file   Social History Narrative  . No narrative on file    No family history on file.  Past Medical History  Diagnosis Date  . Cardiomyopathy  Ischemic  . Ejection fraction      EF 35-40%, echo, August 10, 2010  /      40-45% (better than before) Echo, January, 2010, ICD not indicated / EF 40%, echo, April, 2011  . CHF (congestive heart failure)     Systolic  . Hx of CABG     1998  . CAD (coronary artery disease)     Nuclear, August, 2005, extensive lateral scar, no ischemia /nuclear, August 09, 2010, large lateral and anterolateral scar with no ischemia, ejection fraction 26%  . Dyslipidemia   . Diabetes mellitus     Diagnosis April, 2011  . Hypertension   . GERD (gastroesophageal reflux disease)   . Anxiety   . Carotid artery disease     Dr. Edilia Bo, Doppler, August, 2011, 40-59% LICA  . CVA (cerebral infarction)     April, 2011 likely cardioembolic  . Atrial fibrillation     Atrial fibrillation/flutter, rate control, Coumadin  . Chronic anticoagulation     Pradaxa  . Peripheral neuropathy   . Leg weakness     Arterial leg Dopplers normal, 2009  . Aortic valve sclerosis     No significant stenosis, echo  . Mitral regurgitation     Mild, echo, January, 2010  . PVC's (premature ventricular contractions)   . Positive D-dimer     D-dimer elevated, hospital, April, 2012, chest CT no pulmonary embolus    Past Surgical History  Procedure Date  . Coronary artery bypass graft  1998    ROS  Patient denies fever, chills, headache, sweats, rash, change in vision, change in hearing, chest pain, cough, nausea vomiting, urinary symptoms.  All other systems are reviewed and are negative.  PHYSICAL EXAM Patient is stable today.  He is oriented to person time and place.  Affect is normal.  Head is atraumatic.  There is no xanthelasma.  There is no jugular venous distention.  Lungs are clear.  Respiratory effort is unlabored.  Cardiac exam reveals S1-S2.  No clicks or significant murmurs.  The abdomen is soft.  There is no peripheral edema.  There are no musculoskeletal deformities.  There no skin rashes. Filed Vitals:   11/11/10 1250  BP: 95/59  Pulse: 47  Height: 6\' 1"  (1.854 m)  Weight: 238 lb (107.956 kg)  SpO2: 96%    EKG is done today.  It is reviewed by me.  There is sinus bradycardia.  ASSESSMENT & PLAN

## 2010-12-09 ENCOUNTER — Encounter: Payer: Self-pay | Admitting: Cardiology

## 2010-12-14 ENCOUNTER — Other Ambulatory Visit (INDEPENDENT_AMBULATORY_CARE_PROVIDER_SITE_OTHER): Payer: Medicare Other

## 2010-12-14 DIAGNOSIS — I6529 Occlusion and stenosis of unspecified carotid artery: Secondary | ICD-10-CM

## 2010-12-23 NOTE — Procedures (Unsigned)
CAROTID DUPLEX EXAM  INDICATION:  Follow up carotid disease.  HISTORY: Diabetes:  No. Cardiac:  Yes. Hypertension:  Yes. Smoking:  No. Previous Surgery:  No. CV History: Amaurosis Fugax No, Paresthesias No, Hemiparesis No.                                      RIGHT             LEFT Brachial systolic pressure:         110               112 Brachial Doppler waveforms:         WNL               WNL Vertebral direction of flow:        Antegrade         Antegrade DUPLEX VELOCITIES (cm/sec) CCA peak systolic                   48                47 ECA peak systolic                   94                84 ICA peak systolic                   51                157 ICA end diastolic                   14                46 PLAQUE MORPHOLOGY:                  Heterogenous      Heterogenous PLAQUE AMOUNT:                      Mild              Moderate PLAQUE LOCATION:                    ICA               ICA  IMPRESSION: 1. 1% to 39% right internal carotid artery plaquing. 2. 40% to 59% left internal carotid artery stenosis. 3. Antegrade vertebral arteries bilaterally. 4. Stable results compared to 1 year ago.  ___________________________________________ Di Kindle. Edilia Bo, M.D.  LT/MEDQ  D:  12/14/2010  T:  12/14/2010  Job:  [id number]

## 2011-01-30 ENCOUNTER — Encounter: Payer: Self-pay | Admitting: Cardiology

## 2011-02-03 ENCOUNTER — Ambulatory Visit: Payer: Medicare Other | Admitting: Cardiology

## 2011-03-01 ENCOUNTER — Encounter: Payer: Self-pay | Admitting: Cardiology

## 2011-04-10 ENCOUNTER — Ambulatory Visit (INDEPENDENT_AMBULATORY_CARE_PROVIDER_SITE_OTHER): Payer: Medicare Other | Admitting: Cardiology

## 2011-04-10 ENCOUNTER — Encounter: Payer: Self-pay | Admitting: Cardiology

## 2011-04-10 VITALS — BP 148/77 | HR 54 | Ht 73.0 in | Wt 247.0 lb

## 2011-04-10 DIAGNOSIS — I428 Other cardiomyopathies: Secondary | ICD-10-CM

## 2011-04-10 DIAGNOSIS — I4891 Unspecified atrial fibrillation: Secondary | ICD-10-CM

## 2011-04-10 DIAGNOSIS — I2510 Atherosclerotic heart disease of native coronary artery without angina pectoris: Secondary | ICD-10-CM

## 2011-04-10 NOTE — Assessment & Plan Note (Signed)
The patient's last echo was in April, 2011. Over time I can't fully watch his ejection fraction. His EF has been 40% or higher and I have not recommended an ICD. It is time to repeat his echo. He is on appropriate medications. I will contact him about the echo results. Unless there is any significant change I will see him back in 6 months.

## 2011-04-10 NOTE — Assessment & Plan Note (Signed)
EKG reveals normal sinus rhythm. No change in therapy. The patient is on Xarelto. He is also on low-dose aspirin. With changes in some of the recommendations I have stopped his aspirin today.

## 2011-04-10 NOTE — Progress Notes (Signed)
HPI Patient returns for followup of CAD and cardiomyopathy and atrial fibrillation. He feels great.I saw him last July, 2012. At that time I stopped his diltiazem. He had some question of some mild orthostatic changes. Since that time he feels great. He has no signs of active congestive heart failure. No Known Allergies  Current Outpatient Prescriptions  Medication Sig Dispense Refill  . ARTIFICIAL TEARS ophthalmic solution Place 1 drop into the left eye 2 (two) times daily.       . brimonidine (ALPHAGAN P) 0.1 % SOLN Place 1 drop into the right eye 2 (two) times daily.        . carvedilol (COREG) 25 MG tablet Take 25 mg by mouth 2 (two) times daily with a meal.        . diazepam (VALIUM) 5 MG tablet Take 2.5 mg by mouth every 12 (twelve) hours as needed.        . furosemide (LASIX) 40 MG tablet Take 40 mg by mouth daily.        Marland Kitchen gabapentin (NEURONTIN) 300 MG capsule Take 300 mg by mouth 2 (two) times daily.        Marland Kitchen losartan (COZAAR) 100 MG tablet Take 100 mg by mouth daily.        . meclizine (ANTIVERT) 12.5 MG tablet Take 1 tablet by mouth Once daily as needed.      . metFORMIN (GLUCOPHAGE) 500 MG tablet Take 500 mg by mouth 2 (two) times daily with a meal.        . omeprazole (PRILOSEC OTC) 20 MG tablet Take 20 mg by mouth daily.        . simvastatin (ZOCOR) 40 MG tablet Take 20 mg by mouth at bedtime.       . Tamsulosin HCl (FLOMAX) 0.4 MG CAPS Take 0.4 mg by mouth at bedtime.        . travoprost, benzalkonium, (TRAVATAN) 0.004 % ophthalmic solution Place 1 drop into the right eye at bedtime.        Carlena Hurl 20 MG TABS Take 1 tablet by mouth Daily.        History   Social History  . Marital Status: Married    Spouse Name: N/A    Number of Children: N/A  . Years of Education: N/A   Occupational History  . Not on file.   Social History Main Topics  . Smoking status: Former Smoker -- 1.5 packs/day for 15 years    Types: Cigarettes    Quit date: 05/01/1982  . Smokeless  tobacco: Never Used  . Alcohol Use: No  . Drug Use: No  . Sexually Active: Not on file   Other Topics Concern  . Not on file   Social History Narrative  . No narrative on file    No family history on file.  Past Medical History  Diagnosis Date  . Cardiomyopathy     Ischemic  . Ejection fraction      EF 35-40%, echo, August 10, 2010  /      40-45% (better than before) Echo, January, 2010, ICD not indicated / EF 40%, echo, April, 2011  . CHF (congestive heart failure)     Systolic  . Hx of CABG     1998  . CAD (coronary artery disease)     Nuclear, August, 2005, extensive lateral scar, no ischemia /nuclear, August 09, 2010, large lateral and anterolateral scar with no ischemia, ejection fraction 26%  . Dyslipidemia   .  Diabetes mellitus     Diagnosis April, 2011  . Hypertension   . GERD (gastroesophageal reflux disease)   . Anxiety   . Carotid artery disease     Dr. Edilia Bo, Doppler, August, 2011, 40-59% LICA  . CVA (cerebral infarction)     April, 2011 likely cardioembolic  . Atrial fibrillation     Atrial fibrillation/flutter, rate control, Coumadin  . Chronic anticoagulation     Pradaxa  . Peripheral neuropathy   . Leg weakness     Arterial leg Dopplers normal, 2009  . Aortic valve sclerosis     No significant stenosis, echo  . Mitral regurgitation     Mild, echo, January, 2010  . PVC's (premature ventricular contractions)   . Positive D-dimer     D-dimer elevated, hospital, April, 2012, chest CT no pulmonary embolus    Past Surgical History  Procedure Date  . Coronary artery bypass graft 1998    ROS    Patient denies fever, chills, headache, sweats, rash, change in vision, change in hearing, chest pain, cough, nausea vomiting, urinary symptoms. All other systems are reviewed and are negative.  PHYSICAL EXAM  Patient is oriented to person time and place. Affect is normal. Head is atraumatic. There is no xanthelasma. Lungs are clear. Respiratory effort is  nonlabored. Cardiac exam reveals S1 and S2. There no clicks or significant murmurs. The abdomen is soft. There is no peripheral edema.  Filed Vitals:   04/10/11 0839  BP: 148/77  Pulse: 54  Height: 6\' 1"  (1.854 m)  Weight: 247 lb (112.038 kg)  SpO2: 96%    EKG EKG is done today and reviewed by me. There is normal sinus rhythm. There is nonspecific interventricular conduction delay. There is old anterolateral MI.  ASSESSMENT & PLAN

## 2011-04-10 NOTE — Patient Instructions (Signed)
   Stop Aspirin  Echo If the results of your test are normal or stable, you will receive a letter.  If they are abnormal, the nurse will contact you by phone. Your physician wants you to follow up in: 6 months.  You will receive a reminder letter in the mail one-two months in advance.  If you don't receive a letter, please call our office to schedule the follow up appointment

## 2011-04-10 NOTE — Assessment & Plan Note (Signed)
Coronary disease is stable.  No further workup. 

## 2011-04-12 ENCOUNTER — Other Ambulatory Visit (INDEPENDENT_AMBULATORY_CARE_PROVIDER_SITE_OTHER): Payer: Medicare Other | Admitting: *Deleted

## 2011-04-12 DIAGNOSIS — I428 Other cardiomyopathies: Secondary | ICD-10-CM

## 2011-04-12 DIAGNOSIS — I2510 Atherosclerotic heart disease of native coronary artery without angina pectoris: Secondary | ICD-10-CM

## 2011-04-12 DIAGNOSIS — I4891 Unspecified atrial fibrillation: Secondary | ICD-10-CM

## 2011-04-20 ENCOUNTER — Encounter: Payer: Self-pay | Admitting: Cardiology

## 2011-08-02 IMAGING — CR DG CHEST 1V PORT
1 series · 1 of 1 positions shown · non-contrast
Comparison: None.

CLINICAL DATA: Pulmonary edema.  TIA.  Acute CVA.

PORTABLE CHEST - 1 VIEW

[view not recorded]
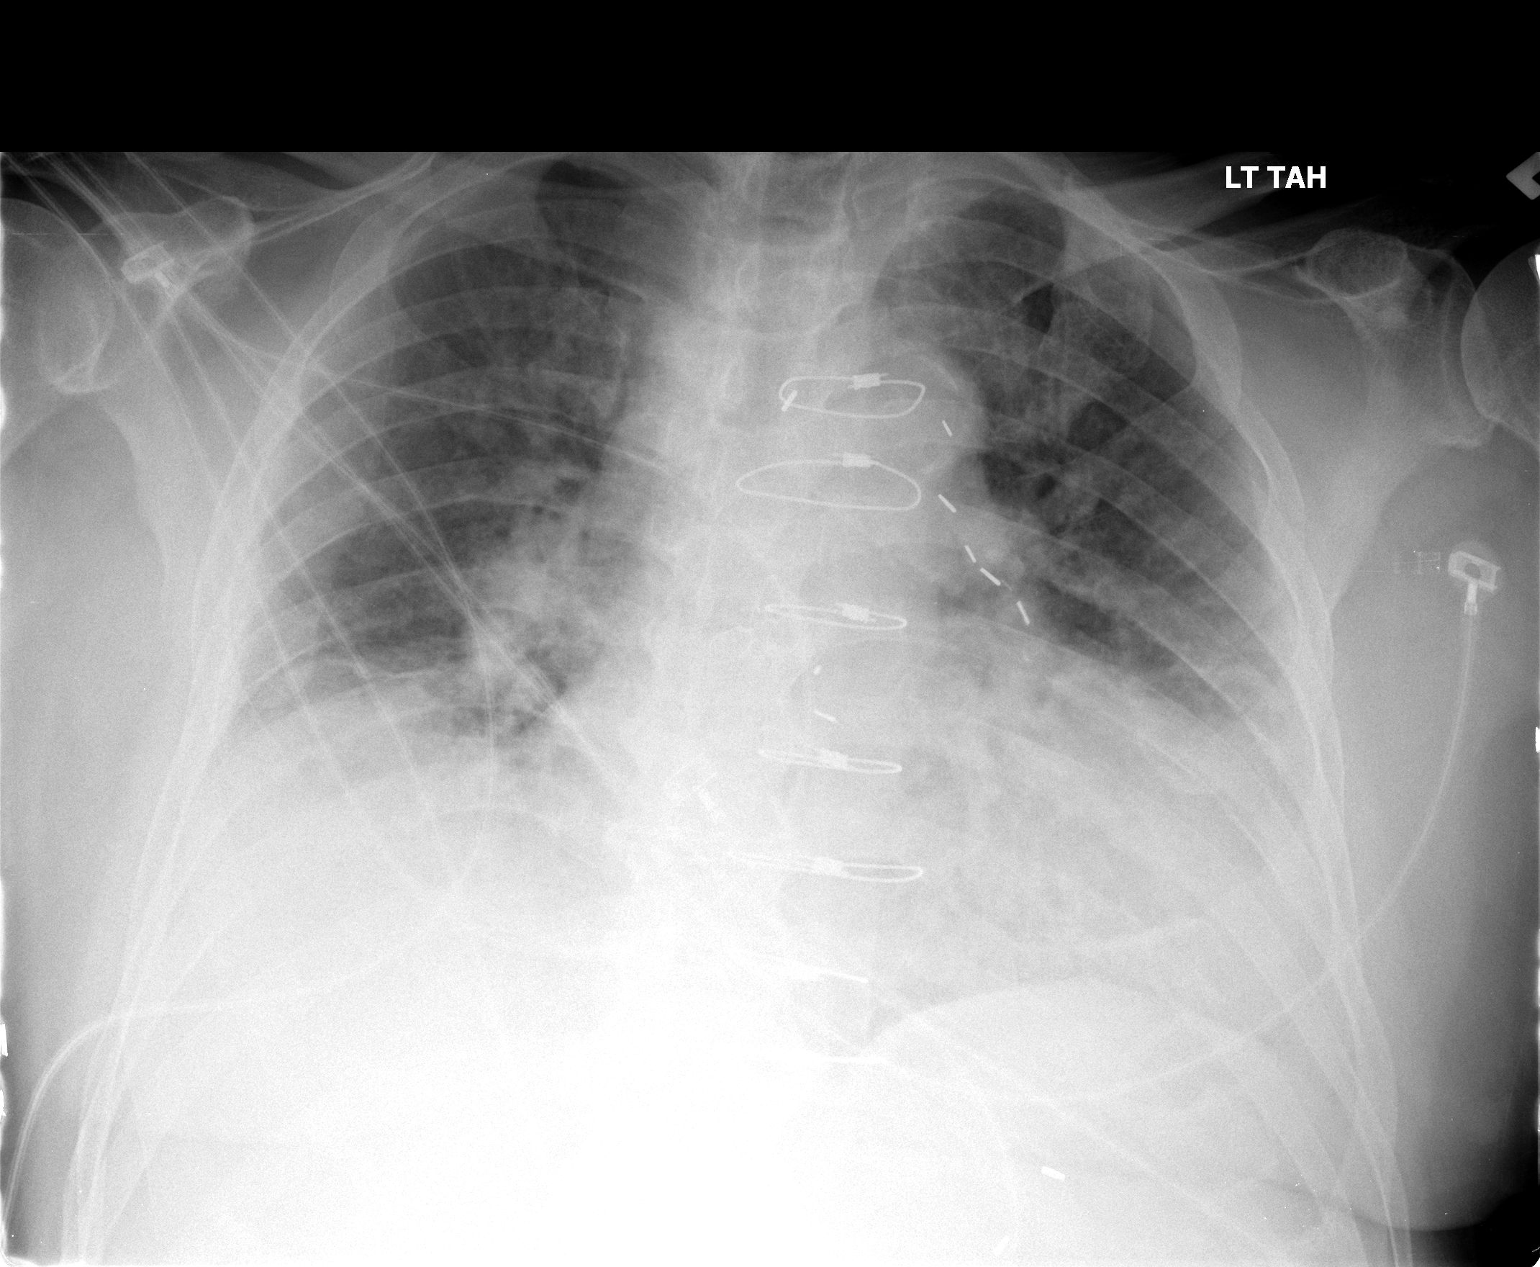

[1 of 1 positions shown; findings below may reference images not displayed]

FINDINGS: Mild atelectasis at the right base.  Pulmonary vascular
congestion.  Sternal wire sutures and mediastinal clips (CABG).
IMPRESSION: Pulmonary vascular congestion.  Mild right base atelectasis.

## 2011-11-10 ENCOUNTER — Other Ambulatory Visit: Payer: Self-pay

## 2011-11-14 ENCOUNTER — Ambulatory Visit: Payer: Medicare Other | Admitting: Cardiology

## 2011-12-21 ENCOUNTER — Encounter: Payer: Self-pay | Admitting: Neurosurgery

## 2011-12-22 ENCOUNTER — Ambulatory Visit (INDEPENDENT_AMBULATORY_CARE_PROVIDER_SITE_OTHER): Payer: Medicare Other | Admitting: *Deleted

## 2011-12-22 ENCOUNTER — Encounter: Payer: Self-pay | Admitting: Neurosurgery

## 2011-12-22 ENCOUNTER — Ambulatory Visit (INDEPENDENT_AMBULATORY_CARE_PROVIDER_SITE_OTHER): Payer: Medicare Other | Admitting: Neurosurgery

## 2011-12-22 VITALS — BP 145/80 | HR 53 | Resp 16 | Ht 72.0 in | Wt 245.3 lb

## 2011-12-22 DIAGNOSIS — I6529 Occlusion and stenosis of unspecified carotid artery: Secondary | ICD-10-CM

## 2011-12-22 NOTE — Progress Notes (Signed)
[facility] Carotid Office Note  CC: Annual carotid surveillance Referring Physician: Edilia Bo  History of Present Illness: 78 year old male patient of Dr. Adele Dan followed for bilateral carotid stenosis. The patient denies any signs or symptoms of CVA, TIA, amaurosis fugax or any neural deficit. Patient also denies any new medical diagnoses or recent surgery.  Past Medical History  Diagnosis Date  . Cardiomyopathy     Ischemic  . Ejection fraction      EF 35-40%, echo, August 10, 2010  /      40-45% (better than before) Echo, January, 2010, ICD not indicated / EF 40%, echo, April, 2011  . CHF (congestive heart failure)     Systolic  . Hx of CABG     1998  . CAD (coronary artery disease)     Nuclear, August, 2005, extensive lateral scar, no ischemia /nuclear, August 09, 2010, large lateral and anterolateral scar with no ischemia, ejection fraction 26%  . Dyslipidemia   . Diabetes mellitus     Diagnosis April, 2011  . Hypertension   . GERD (gastroesophageal reflux disease)   . Anxiety   . Carotid artery disease     Dr. Edilia Bo, Doppler, August, 2011, 40-59% LICA  . CVA (cerebral infarction)     April, 2011 likely cardioembolic  . Atrial fibrillation     Atrial fibrillation/flutter, rate control, Coumadin  . Chronic anticoagulation     Pradaxa  . Peripheral neuropathy   . Leg weakness     Arterial leg Dopplers normal, 2009  . Aortic valve sclerosis     No significant stenosis, echo  . Mitral regurgitation     Mild, echo, January, 2010  . PVC's (premature ventricular contractions)   . Positive D-dimer     D-dimer elevated, hospital, April, 2012, chest CT no pulmonary embolus    ROS: [x]  Positive   [ ]  Denies    General: [ ]  Weight loss, [ ]  Fever, [ ]  chills Neurologic: [ ]  Dizziness, [ ]  Blackouts, [ ]  Seizure [ ]  Stroke, [ ]  "Mini stroke", [ ]  Slurred speech, [ ]  Temporary blindness; [ ]  weakness in arms or legs, [ ]  Hoarseness Cardiac: [  ] Chest pain/pressure, [ ]  Shortness of breath at rest [ ]  Shortness of breath with exertion, [ ]  Atrial fibrillation or irregular heartbeat Vascular: [ ]  Pain in legs with walking, [ ]  Pain in legs at rest, [ ]  Pain in legs at night,  [ ]  Non-healing ulcer, [ ]  Blood clot in vein/DVT,   Pulmonary: [ ]  Home oxygen, [ ]  Productive cough, [ ]  Coughing up blood, [ ]  Asthma,  [ ]  Wheezing Musculoskeletal:  [ ]  Arthritis, [ ]  Low back pain, [ ]  Joint pain Hematologic: [ ]  Easy Bruising, [ ]  Anemia; [ ]  Hepatitis Gastrointestinal: [ ]  Blood in stool, [ ]  Gastroesophageal Reflux/heartburn, [ ]  Trouble swallowing Urinary: [ ]  chronic Kidney disease, [ ]  on HD - [ ]  MWF or [ ]  TTHS, [ ]  Burning with urination, [ ]  Difficulty urinating Skin: [ ]  Rashes, [ ]  Wounds Psychological: [ ]  Anxiety, [ ]  Depression   Social History History  Substance Use Topics  . Smoking status: Former Smoker -- 1.5 packs/day for 15 years    Types: Cigarettes    Quit date: 05/01/1982  . Smokeless tobacco: Never Used  . Alcohol Use: No    Family History No family history on file.  No Known Allergies  Current Outpatient Prescriptions  Medication Sig Dispense Refill  . ARTIFICIAL TEARS ophthalmic solution Place 1 drop into the left eye 2 (two) times daily.       . brimonidine (ALPHAGAN P) 0.1 % SOLN Place 1 drop into the right eye 2 (two) times daily.        . carvedilol (COREG) 25 MG tablet Take 25 mg by mouth 2 (two) times daily with a meal.        . diazepam (VALIUM) 5 MG tablet Take 2.5 mg by mouth every 12 (twelve) hours as needed.        . furosemide (LASIX) 40 MG tablet Take 40 mg by mouth daily.        Marland Kitchen gabapentin (NEURONTIN) 300 MG capsule Take 300 mg by mouth 2 (two) times daily.        Marland Kitchen losartan (COZAAR) 100 MG tablet Take 100 mg by mouth daily.        . meclizine (ANTIVERT) 12.5 MG tablet Take 1 tablet by mouth Once daily as needed.      . metFORMIN (GLUCOPHAGE) 500 MG tablet Take 500 mg by mouth 2  (two) times daily with a meal.        . omeprazole (PRILOSEC OTC) 20 MG tablet Take 20 mg by mouth daily.        . simvastatin (ZOCOR) 40 MG tablet Take 20 mg by mouth at bedtime.       . Tamsulosin HCl (FLOMAX) 0.4 MG CAPS Take 0.4 mg by mouth at bedtime.        . travoprost, benzalkonium, (TRAVATAN) 0.004 % ophthalmic solution Place 1 drop into the right eye at bedtime.        Carlena Hurl 20 MG TABS Take 1 tablet by mouth Daily.        Physical Examination  Filed Vitals:   12/22/11 1404  BP: 147/79  Pulse: 55  Resp: 16    Body mass index is 33.27 kg/(m^2).  General:  WDWN in NAD Gait: Normal HEENT: WNL Eyes: Pupils equal Pulmonary: normal non-labored breathing , without Rales, rhonchi,  wheezing Cardiac: RRR, without  Murmurs, rubs or gallops; Abdomen: soft, NT, no masses Skin: no rashes, ulcers noted  Vascular Exam Pulses: 3+ radial pulses bilaterally Carotid bruits: Carotid pulses to auscultation no bruits are heard Extremities without ischemic changes, no Gangrene , no cellulitis; no open wounds;  Musculoskeletal: no muscle wasting or atrophy   Neurologic: A&O X 3; Appropriate Affect ; SENSATION: normal; MOTOR FUNCTION:  moving all extremities equally. Speech is fluent/normal  Non-Invasive Vascular Imaging CAROTID DUPLEX 12/22/2011  Right ICA 20 - 39 % stenosis Left ICA 40 - 59 % stenosis   ASSESSMENT/PLAN: Asymptomatic patient with mild to moderate carotid stenosis left greater than right which is essentially unchanged from previous exam in August 2012. The patient will followup in one year with repeat carotid duplex. The patient's questions were encouraged and answered, he is in agreement with this plan.  Lauree Chandler ANP   Clinic MD: Imogene Burn

## 2011-12-26 NOTE — Addendum Note (Signed)
Addended by: Sharee Pimple on: 12/26/2011 09:37 AM   Modules accepted: Orders

## 2012-01-18 ENCOUNTER — Encounter: Payer: Self-pay | Admitting: Cardiology

## 2012-01-19 ENCOUNTER — Encounter: Payer: Self-pay | Admitting: Cardiology

## 2012-01-19 ENCOUNTER — Ambulatory Visit (INDEPENDENT_AMBULATORY_CARE_PROVIDER_SITE_OTHER): Payer: Medicare Other | Admitting: Cardiology

## 2012-01-19 VITALS — BP 154/85 | HR 61 | Ht 72.0 in | Wt 250.1 lb

## 2012-01-19 DIAGNOSIS — I2510 Atherosclerotic heart disease of native coronary artery without angina pectoris: Secondary | ICD-10-CM

## 2012-01-19 DIAGNOSIS — I255 Ischemic cardiomyopathy: Secondary | ICD-10-CM

## 2012-01-19 DIAGNOSIS — I4891 Unspecified atrial fibrillation: Secondary | ICD-10-CM

## 2012-01-19 DIAGNOSIS — I779 Disorder of arteries and arterioles, unspecified: Secondary | ICD-10-CM

## 2012-01-19 DIAGNOSIS — I2589 Other forms of chronic ischemic heart disease: Secondary | ICD-10-CM

## 2012-01-19 NOTE — Assessment & Plan Note (Signed)
Atrial fib is controlled. He is on Xarelto and doing well. We will check to see when he has had recent labs. He would need to be managed CBC if he has not had them in the last 6 months.  The patient has asked me to take over the care of his wife's cardiology care and I am happy to do so

## 2012-01-19 NOTE — Progress Notes (Signed)
HPI  Patient is seen for followup of coronary disease and cardiomyopathy and atrial fibrillation. He actually is doing very well. He is taking Xarelto without any difficulty. He had followup carotid Dopplers through the vascular surgery team. This is stable. We will follow his Dopplers in our office until help from vascular surgery as needed.   When I saw him last in December, 2012 we proceed with a 2-D echo. His ejection fraction was good at 45-50%. I was pleased with this. No changes needed.    No Known Allergies  Current Outpatient Prescriptions  Medication Sig Dispense Refill  . ARTIFICIAL TEARS ophthalmic solution Place 1 drop into the left eye 2 (two) times daily.       . brimonidine (ALPHAGAN P) 0.1 % SOLN Place 1 drop into the right eye 2 (two) times daily.        . carvedilol (COREG) 25 MG tablet Take 25 mg by mouth 2 (two) times daily with a meal.        . diazepam (VALIUM) 5 MG tablet Take 2.5 mg by mouth every 12 (twelve) hours as needed.        . furosemide (LASIX) 40 MG tablet Take 40 mg by mouth daily.        Marland Kitchen gabapentin (NEURONTIN) 300 MG capsule Take 300 mg by mouth 2 (two) times daily.        Marland Kitchen latanoprost (XALATAN) 0.005 % ophthalmic solution Place into both eyes at bedtime.      Marland Kitchen losartan (COZAAR) 100 MG tablet Take 100 mg by mouth daily.        . meclizine (ANTIVERT) 12.5 MG tablet Take 1 tablet by mouth Once daily as needed.      . metFORMIN (GLUCOPHAGE) 500 MG tablet Take 500 mg by mouth 2 (two) times daily with a meal.        . omeprazole (PRILOSEC OTC) 20 MG tablet Take 20 mg by mouth daily.        . simvastatin (ZOCOR) 40 MG tablet Take 20 mg by mouth at bedtime.       . Tamsulosin HCl (FLOMAX) 0.4 MG CAPS Take 0.4 mg by mouth at bedtime.        Carlena Hurl 20 MG TABS Take 1 tablet by mouth Daily.        History   Social History  . Marital Status: Married    Spouse Name: N/A    Number of Children: N/A  . Years of Education: N/A   Occupational History    . Not on file.   Social History Main Topics  . Smoking status: Former Smoker -- 1.5 packs/day for 15 years    Types: Cigarettes    Quit date: 05/01/1982  . Smokeless tobacco: Never Used  . Alcohol Use: No  . Drug Use: No  . Sexually Active: Not on file   Other Topics Concern  . Not on file   Social History Narrative  . No narrative on file    No family history on file.  Past Medical History  Diagnosis Date  . Cardiomyopathy     Ischemic  . Ejection fraction      EF 35-40%, echo, August 10, 2010  /      40-45% (better than before) Echo, January, 2010, ICD not indicated / EF 40%, echo, April, 2011  . CHF (congestive heart failure)     Systolic  . Hx of CABG     1998  . CAD (coronary  artery disease)     Nuclear, August, 2005, extensive lateral scar, no ischemia /nuclear, August 09, 2010, large lateral and anterolateral scar with no ischemia, ejection fraction 26%  . Dyslipidemia   . Diabetes mellitus     Diagnosis April, 2011  . Hypertension   . GERD (gastroesophageal reflux disease)   . Anxiety   . Carotid artery disease     Dr. Edilia Bo, Doppler, August, 2011, 40-59% LICA  . CVA (cerebral infarction)     April, 2011 likely cardioembolic  . Atrial fibrillation     Atrial fibrillation/flutter, rate control, Coumadin  . Chronic anticoagulation     Pradaxa  . Peripheral neuropathy   . Leg weakness     Arterial leg Dopplers normal, 2009  . Aortic valve sclerosis     No significant stenosis, echo  . Mitral regurgitation     Mild, echo, January, 2010  . PVC's (premature ventricular contractions)   . Positive D-dimer     D-dimer elevated, hospital, April, 2012, chest CT no pulmonary embolus    Past Surgical History  Procedure Date  . Coronary artery bypass graft 1998    ROS  Patient denies fever, chills, headache, sweats, rash, change in vision, change in hearing, chest pain, cough, nausea vomiting, urinary symptoms. All other systems are reviewed and are  negative.  PHYSICAL EXAM  Filed Vitals:   01/19/12 1022  BP: 154/85  Pulse: 61  Height: 6' (1.829 m)  Weight: 250 lb 1.9 oz (113.454 kg)  SpO2: 95%   Patient is oriented to person time and place. Affect is normal. Lungs are clear. Respiratory effort is nonlabored. Cardiac exam reveals S1 and S2. There no clicks or significant murmurs. The abdomen is soft. There is no peripheral edema.  ASSESSMENT & PLAN

## 2012-01-19 NOTE — Assessment & Plan Note (Signed)
Patient is stable. His most recent echo in December, 2012 showed his ejection fraction up to 45-50%. He is stable on appropriate medications. He does not need an ICD. No change in therapy.

## 2012-01-19 NOTE — Patient Instructions (Addendum)

## 2012-01-19 NOTE — Assessment & Plan Note (Signed)
Coronary disease is stable. No change in therapy. 

## 2012-11-14 ENCOUNTER — Other Ambulatory Visit: Payer: Self-pay | Admitting: *Deleted

## 2012-11-14 DIAGNOSIS — I779 Disorder of arteries and arterioles, unspecified: Secondary | ICD-10-CM

## 2012-11-21 ENCOUNTER — Encounter (INDEPENDENT_AMBULATORY_CARE_PROVIDER_SITE_OTHER): Payer: Medicare Other

## 2012-11-21 DIAGNOSIS — I6529 Occlusion and stenosis of unspecified carotid artery: Secondary | ICD-10-CM

## 2012-11-27 ENCOUNTER — Encounter: Payer: Self-pay | Admitting: Cardiology

## 2012-11-29 ENCOUNTER — Telehealth: Payer: Self-pay | Admitting: *Deleted

## 2012-11-29 NOTE — Telephone Encounter (Signed)
Patient informed. 

## 2012-11-29 NOTE — Telephone Encounter (Signed)
Message copied by Eustace Moore on Fri Nov 29, 2012  3:32 PM ------      Message from: Myrtis Ser, [place] D      Created: Wed Nov 27, 2012  5:16 PM       Please let the patient know that we will continue to follow his Dopplers carefully. I have reviewed the results of the recent study. He does need followup in 6 months. ------

## 2012-12-06 ENCOUNTER — Encounter: Payer: Self-pay | Admitting: Cardiology

## 2012-12-06 ENCOUNTER — Ambulatory Visit (INDEPENDENT_AMBULATORY_CARE_PROVIDER_SITE_OTHER): Payer: Medicare Other | Admitting: Cardiology

## 2012-12-06 VITALS — BP 126/69 | HR 57 | Ht 72.0 in | Wt 239.0 lb

## 2012-12-06 DIAGNOSIS — I2589 Other forms of chronic ischemic heart disease: Secondary | ICD-10-CM

## 2012-12-06 DIAGNOSIS — I255 Ischemic cardiomyopathy: Secondary | ICD-10-CM | POA: Insufficient documentation

## 2012-12-06 DIAGNOSIS — I4891 Unspecified atrial fibrillation: Secondary | ICD-10-CM

## 2012-12-06 NOTE — Assessment & Plan Note (Signed)
The patient's most recent ejection fraction was 45-50%. Therefore ICD was not needed. He does not need a followup echo at this time. I may consider a followup in 6 months. Overall he stable. No change in therapy.

## 2012-12-06 NOTE — Progress Notes (Signed)
HPI  Patient is seen today to followup cardiomyopathy and atrial fibrillation and coronary disease. He is doing well. We have transferred his carotid Doppler followup to our office. He has a study in July, 2014. There is 1-39% R. ICA stenosis and 60-79% LICA stenosis. This study seems to show some mild progression of the LICA since the study of August, 2013 in Dr. Adele Dan office. Plan is for six-month followup.   The patient continues to have leg fatigue. He's not having any significant shortness of breath or chest pain.  No Known Allergies  Current Outpatient Prescriptions  Medication Sig Dispense Refill  . ARTIFICIAL TEARS ophthalmic solution Place 1 drop into the left eye 2 (two) times daily.       . brimonidine (ALPHAGAN P) 0.1 % SOLN Place 1 drop into the right eye 2 (two) times daily.        . carvedilol (COREG) 25 MG tablet Take 25 mg by mouth 2 (two) times daily with a meal.        . diazepam (VALIUM) 5 MG tablet Take 2.5 mg by mouth every 12 (twelve) hours as needed.        . furosemide (LASIX) 40 MG tablet Take 40 mg by mouth daily.        Marland Kitchen gabapentin (NEURONTIN) 300 MG capsule Take 300 mg by mouth 2 (two) times daily.        Marland Kitchen latanoprost (XALATAN) 0.005 % ophthalmic solution Place into both eyes at bedtime.      Marland Kitchen losartan (COZAAR) 100 MG tablet Take 100 mg by mouth daily.        . meclizine (ANTIVERT) 12.5 MG tablet Take 1 tablet by mouth Once daily as needed.      . metFORMIN (GLUCOPHAGE) 500 MG tablet Take 500 mg by mouth 2 (two) times daily with a meal.        . omeprazole (PRILOSEC OTC) 20 MG tablet Take 20 mg by mouth daily.        . simvastatin (ZOCOR) 40 MG tablet Take 20 mg by mouth at bedtime.       . Tamsulosin HCl (FLOMAX) 0.4 MG CAPS Take 0.4 mg by mouth at bedtime.        Carlena Hurl 20 MG TABS Take 1 tablet by mouth Daily.       No current facility-administered medications for this visit.    History   Social History  . Marital Status: Married    Spouse  Name: N/A    Number of Children: N/A  . Years of Education: N/A   Occupational History  . Not on file.   Social History Main Topics  . Smoking status: Former Smoker -- 1.50 packs/day for 15 years    Types: Cigarettes    Quit date: 05/01/1982  . Smokeless tobacco: Never Used  . Alcohol Use: No  . Drug Use: No  . Sexually Active: Not on file   Other Topics Concern  . Not on file   Social History Narrative  . No narrative on file    History reviewed. No pertinent family history.  Past Medical History  Diagnosis Date  . Cardiomyopathy     Ischemic  . Ejection fraction      EF 35-40%, echo, August 10, 2010  /      40-45% (better than before) Echo, January, 2010, ICD not indicated / EF 40%, echo, April, 2011  . CHF (congestive heart failure)     Systolic  .  Hx of CABG     1998  . CAD (coronary artery disease)     Nuclear, August, 2005, extensive lateral scar, no ischemia /nuclear, August 09, 2010, large lateral and anterolateral scar with no ischemia, ejection fraction 26%  . Dyslipidemia   . Diabetes mellitus     Diagnosis April, 2011  . Hypertension   . GERD (gastroesophageal reflux disease)   . Anxiety   . Carotid artery disease     Dr. Edilia Bo, Doppler, August, 2011, 40-59% LICA  . CVA (cerebral infarction)     April, 2011 likely cardioembolic  . Atrial fibrillation     Atrial fibrillation/flutter, rate control, Coumadin  . Chronic anticoagulation     Pradaxa  . Peripheral neuropathy   . Leg weakness     Arterial leg Dopplers normal, 2009  . Aortic valve sclerosis     No significant stenosis, echo  . Mitral regurgitation     Mild, echo, January, 2010  . PVC's (premature ventricular contractions)   . Positive D-dimer     D-dimer elevated, hospital, April, 2012, chest CT no pulmonary embolus    Past Surgical History  Procedure Laterality Date  . Coronary artery bypass graft  1998    Patient Active Problem List   Diagnosis Date Noted  . Occlusion and  stenosis of carotid artery without mention of cerebral infarction 12/22/2011  . Cardiomyopathy   . Ejection fraction   . CHF (congestive heart failure)   . Hx of CABG   . CAD (coronary artery disease)   . Dyslipidemia   . Diabetes mellitus   . Hypertension   . GERD (gastroesophageal reflux disease)   . Anxiety   . Carotid artery disease   . CVA (cerebral infarction)   . Atrial fibrillation   . Chronic anticoagulation   . Peripheral neuropathy   . Leg weakness   . Aortic valve sclerosis   . Mitral regurgitation   . PVC's (premature ventricular contractions)   . Positive D-dimer     ROS   Patient denies fever, chills, headache, sweats, rash, change in vision, change in hearing, chest pain, cough, nausea vomiting, urinary symptoms. All other systems are reviewed and are negative.  PHYSICAL EXAM  Patient is here with his wife. He is oriented to person time and place. Affect is normal. There is no jugulovenous distention. Lungs are clear. Respiratory effort is nonlabored. Cardiac exam reveals S1 and S2. There no clicks or significant murmurs. The abdomen is soft. There is no peripheral edema.  Filed Vitals:   12/06/12 1338  BP: 126/69  Pulse: 57  Height: 6' (1.829 m)  Weight: 239 lb (108.41 kg)     ASSESSMENT & PLAN

## 2012-12-06 NOTE — Patient Instructions (Addendum)

## 2012-12-06 NOTE — Assessment & Plan Note (Signed)
Atrial fibrillation controlled and the patient is anticoagulated. No change in therapy.

## 2012-12-27 ENCOUNTER — Ambulatory Visit: Payer: Medicare Other | Admitting: Neurosurgery

## 2012-12-27 ENCOUNTER — Other Ambulatory Visit: Payer: Medicare Other

## 2013-03-06 ENCOUNTER — Other Ambulatory Visit: Payer: Self-pay

## 2013-03-12 ENCOUNTER — Other Ambulatory Visit: Payer: Self-pay | Admitting: Cardiology

## 2013-03-12 MED ORDER — RIVAROXABAN 20 MG PO TABS: 20.0000 mg | ORAL_TABLET | Freq: Every day | ORAL | Status: DC

## 2013-03-12 NOTE — Telephone Encounter (Signed)
Pt wife called requesting samples of Xarleto 20 MG. 10 Boxes = 50 Tablets. Lot Number [id number] EXP 7/16. Samples placed up front in pt pick up basket on Wednesday 03-12-13. Pt wife will pick up her and his samples tomorrow Thursday 03-13-13

## 2013-07-01 ENCOUNTER — Other Ambulatory Visit: Payer: Self-pay | Admitting: *Deleted

## 2013-07-01 MED ORDER — RIVAROXABAN 20 MG PO TABS: 20.0000 mg | ORAL_TABLET | Freq: Every day | ORAL | Status: DC

## 2013-10-03 ENCOUNTER — Encounter: Payer: Self-pay | Admitting: Cardiology

## 2013-10-03 ENCOUNTER — Ambulatory Visit (INDEPENDENT_AMBULATORY_CARE_PROVIDER_SITE_OTHER): Payer: Medicare Other | Admitting: Cardiology

## 2013-10-03 VITALS — BP 119/66 | HR 57 | Ht 72.0 in | Wt 210.8 lb

## 2013-10-03 DIAGNOSIS — Z951 Presence of aortocoronary bypass graft: Secondary | ICD-10-CM

## 2013-10-03 DIAGNOSIS — I10 Essential (primary) hypertension: Secondary | ICD-10-CM

## 2013-10-03 DIAGNOSIS — I5042 Chronic combined systolic (congestive) and diastolic (congestive) heart failure: Secondary | ICD-10-CM

## 2013-10-03 DIAGNOSIS — I739 Peripheral vascular disease, unspecified: Secondary | ICD-10-CM

## 2013-10-03 DIAGNOSIS — I779 Disorder of arteries and arterioles, unspecified: Secondary | ICD-10-CM

## 2013-10-03 DIAGNOSIS — I509 Heart failure, unspecified: Secondary | ICD-10-CM

## 2013-10-03 DIAGNOSIS — I2510 Atherosclerotic heart disease of native coronary artery without angina pectoris: Secondary | ICD-10-CM

## 2013-10-03 DIAGNOSIS — E785 Hyperlipidemia, unspecified: Secondary | ICD-10-CM

## 2013-10-03 NOTE — Assessment & Plan Note (Signed)
Nuclear in 2012 showed an old scar but no ischemia. No change in therapy.

## 2013-10-03 NOTE — Assessment & Plan Note (Signed)
Blood pressure is controlled. No change in therapy. 

## 2013-10-03 NOTE — Patient Instructions (Signed)
Your physician has requested that you have a carotid duplex. This test is an ultrasound of the carotid arteries in your neck. It looks at blood flow through these arteries that supply the brain with blood. Allow one hour for this exam. There are no restrictions or special instructions. Office will contact with results via phone or letter.   Continue all current medications. Your physician wants you to follow up in:  1 year.  You will receive a reminder letter in the mail one-two months in advance.  If you don't receive a letter, please call our office to schedule the follow up appointment   

## 2013-10-03 NOTE — Assessment & Plan Note (Signed)
It is time for followup carotid Doppler. This will be scheduled.

## 2013-10-03 NOTE — Progress Notes (Signed)
Patient ID: Walter [name], male   DOB: 1931-06-29, 80 y.o.   MRN: [id number]    HPI  Patient is seen today to followup coronary disease. I saw him last August, 2014. Fortunately he continues to do well. He's not having any significant chest pain. We did followup with carotid Dopplers last year. They were stable and and this time to arrange for this again.  No Known Allergies  Current Outpatient Prescriptions  Medication Sig Dispense Refill  . ARTIFICIAL TEARS ophthalmic solution Place 1 drop into the left eye 2 (two) times daily.       . brimonidine (ALPHAGAN P) 0.1 % SOLN Place 1 drop into the right eye 2 (two) times daily.        . carvedilol (COREG) 25 MG tablet Take 25 mg by mouth 2 (two) times daily with a meal.        . diazepam (VALIUM) 5 MG tablet Take 2.5 mg by mouth every 12 (twelve) hours as needed.        . furosemide (LASIX) 40 MG tablet Take 40 mg by mouth daily.        Marland Kitchen gabapentin (NEURONTIN) 300 MG capsule Take 300 mg by mouth 2 (two) times daily.        Marland Kitchen latanoprost (XALATAN) 0.005 % ophthalmic solution Place into both eyes at bedtime.      Marland Kitchen losartan (COZAAR) 100 MG tablet Take 100 mg by mouth daily.        . meclizine (ANTIVERT) 12.5 MG tablet Take 1 tablet by mouth Once daily as needed.      Marland Kitchen omeprazole (PRILOSEC OTC) 20 MG tablet Take 20 mg by mouth daily.        . Rivaroxaban (XARELTO) 20 MG TABS tablet Take 1 tablet (20 mg total) by mouth daily with supper.  60 tablet  0  . simvastatin (ZOCOR) 40 MG tablet Take 20 mg by mouth at bedtime.       . Tamsulosin HCl (FLOMAX) 0.4 MG CAPS Take 0.4 mg by mouth at bedtime.         No current facility-administered medications for this visit.    History   Social History  . Marital Status: Married    Spouse Name: N/A    Number of Children: N/A  . Years of Education: N/A   Occupational History  . Not on file.   Social History Main Topics  . Smoking status: Former Smoker -- 1.50 packs/day for 15 years    Types:  Cigarettes    Quit date: 05/01/1982  . Smokeless tobacco: Never Used  . Alcohol Use: No  . Drug Use: No  . Sexual Activity: Not on file   Other Topics Concern  . Not on file   Social History Narrative  . No narrative on file    History reviewed. No pertinent family history.  Past Medical History  Diagnosis Date  . Ischemic cardiomyopathy     Ischemic  . Ejection fraction      EF 35-40%, echo, August 10, 2010  /      40-45% (better than before) Echo, January, 2010, ICD not indicated / EF 40%, echo, April, 2011  . CHF (congestive heart failure)     Systolic  . Hx of CABG     1998  . CAD (coronary artery disease)     Nuclear, August, 2005, extensive lateral scar, no ischemia /nuclear, August 09, 2010, large lateral and anterolateral scar with no ischemia, ejection fraction 26%  .  Dyslipidemia   . Diabetes mellitus     Diagnosis April, 2011  . Hypertension   . GERD (gastroesophageal reflux disease)   . Anxiety   . Carotid artery disease     Dr. Scot Dock, Doppler, August, 3614, 43-15% LICA  . CVA (cerebral infarction)     April, 2011 likely cardioembolic  . Atrial fibrillation     Atrial fibrillation/flutter, rate control, Coumadin  . Chronic anticoagulation     Pradaxa  . Peripheral neuropathy   . Leg weakness     Arterial leg Dopplers normal, 2009  . Aortic valve sclerosis     No significant stenosis, echo  . Mitral regurgitation     Mild, echo, January, 2010  . PVC's (premature ventricular contractions)   . Positive D-dimer     D-dimer elevated, hospital, April, 2012, chest CT no pulmonary embolus    Past Surgical History  Procedure Laterality Date  . Coronary artery bypass graft  1998    Patient Active Problem List   Diagnosis Date Noted  . Ischemic cardiomyopathy   . Occlusion and stenosis of carotid artery without mention of cerebral infarction 12/22/2011  . Ejection fraction   . CHF (congestive heart failure)   . Hx of CABG   . CAD (coronary artery  disease)   . Dyslipidemia   . Diabetes mellitus   . Hypertension   . GERD (gastroesophageal reflux disease)   . Anxiety   . Carotid artery disease   . CVA (cerebral infarction)   . Atrial fibrillation   . Chronic anticoagulation   . Peripheral neuropathy   . Leg weakness   . Aortic valve sclerosis   . Mitral regurgitation   . PVC's (premature ventricular contractions)   . Positive D-dimer     ROS   Patient denies fever, chills, headache, sweats, rash, change in vision, change in hearing, chest pain, cough, nausea vomiting, urinary symptoms. All other systems are reviewed and are negative.  PHYSICAL EXAM  Patient is overweight. He is oriented to person time and place. Affect is normal. He walks with a cane. Head is atraumatic. Sclera and conjunctiva are normal. There is no jugulovenous distention. Lungs are clear. Respiratory effort is nonlabored. Cardiac exam reveals S1 and S2. There are no clicks or significant murmurs. Abdomen is soft. There is no peripheral edema. There no musculoskeletal deformities. There are no skin rashes.  Filed Vitals:   10/03/13 1412  BP: 119/66  Pulse: 57  Height: 6' (1.829 m)  Weight: 210 lb 12.8 oz (95.618 kg)   EKG is done today and reviewed by me. There is sinus rhythm with slight sinus bradycardia. There is left axis deviation. There is no significant change from the past.  ASSESSMENT & PLAN

## 2013-10-03 NOTE — Assessment & Plan Note (Signed)
His volume status is stable. No change in therapy. 

## 2013-10-03 NOTE — Assessment & Plan Note (Signed)
Patient is on a statin for his coronary disease.

## 2013-10-03 NOTE — Assessment & Plan Note (Signed)
Volume status is stable. No change in therapy. 

## 2013-10-03 NOTE — Assessment & Plan Note (Signed)
Patient had his bypass surgery in 1998. He is doing great.

## 2013-10-09 ENCOUNTER — Encounter (INDEPENDENT_AMBULATORY_CARE_PROVIDER_SITE_OTHER): Payer: Medicare Other

## 2013-10-09 DIAGNOSIS — I739 Peripheral vascular disease, unspecified: Principal | ICD-10-CM

## 2013-10-09 DIAGNOSIS — I779 Disorder of arteries and arterioles, unspecified: Secondary | ICD-10-CM

## 2013-10-09 DIAGNOSIS — I6529 Occlusion and stenosis of unspecified carotid artery: Secondary | ICD-10-CM

## 2013-10-13 ENCOUNTER — Encounter: Payer: Self-pay | Admitting: Cardiology

## 2013-10-14 ENCOUNTER — Telehealth: Payer: Self-pay | Admitting: *Deleted

## 2013-10-14 NOTE — Telephone Encounter (Signed)
Message copied by Merlene Laughter on Tue Oct 14, 2013  4:17 PM ------      Message from: Ron Parker, [place] D      Created: Mon Oct 13, 2013 10:21 AM       Please let the patient know that his carotid Dopplers completely stable. Because it has remained so stable, to next Doppler can be in one year ------

## 2013-10-14 NOTE — Telephone Encounter (Signed)
Patient informed. 

## 2014-02-13 ENCOUNTER — Other Ambulatory Visit: Payer: Self-pay

## 2014-03-12 ENCOUNTER — Other Ambulatory Visit: Payer: Self-pay | Admitting: *Deleted

## 2014-03-12 MED ORDER — RIVAROXABAN 20 MG PO TABS: 20.0000 mg | ORAL_TABLET | Freq: Every day | ORAL | Status: DC

## 2014-05-06 DIAGNOSIS — M5430 Sciatica, unspecified side: Secondary | ICD-10-CM | POA: Diagnosis not present

## 2014-05-06 DIAGNOSIS — M9902 Segmental and somatic dysfunction of thoracic region: Secondary | ICD-10-CM | POA: Diagnosis not present

## 2014-05-06 DIAGNOSIS — M9904 Segmental and somatic dysfunction of sacral region: Secondary | ICD-10-CM | POA: Diagnosis not present

## 2014-05-06 DIAGNOSIS — M9903 Segmental and somatic dysfunction of lumbar region: Secondary | ICD-10-CM | POA: Diagnosis not present

## 2014-05-13 DIAGNOSIS — M9903 Segmental and somatic dysfunction of lumbar region: Secondary | ICD-10-CM | POA: Diagnosis not present

## 2014-05-13 DIAGNOSIS — M9902 Segmental and somatic dysfunction of thoracic region: Secondary | ICD-10-CM | POA: Diagnosis not present

## 2014-05-13 DIAGNOSIS — M9904 Segmental and somatic dysfunction of sacral region: Secondary | ICD-10-CM | POA: Diagnosis not present

## 2014-05-13 DIAGNOSIS — M5430 Sciatica, unspecified side: Secondary | ICD-10-CM | POA: Diagnosis not present

## 2014-06-01 DIAGNOSIS — M9903 Segmental and somatic dysfunction of lumbar region: Secondary | ICD-10-CM | POA: Diagnosis not present

## 2014-06-01 DIAGNOSIS — M9904 Segmental and somatic dysfunction of sacral region: Secondary | ICD-10-CM | POA: Diagnosis not present

## 2014-06-01 DIAGNOSIS — M9902 Segmental and somatic dysfunction of thoracic region: Secondary | ICD-10-CM | POA: Diagnosis not present

## 2014-06-01 DIAGNOSIS — M5430 Sciatica, unspecified side: Secondary | ICD-10-CM | POA: Diagnosis not present

## 2014-06-03 DIAGNOSIS — R300 Dysuria: Secondary | ICD-10-CM | POA: Diagnosis not present

## 2014-06-03 DIAGNOSIS — N411 Chronic prostatitis: Secondary | ICD-10-CM | POA: Diagnosis not present

## 2014-06-04 DIAGNOSIS — M9904 Segmental and somatic dysfunction of sacral region: Secondary | ICD-10-CM | POA: Diagnosis not present

## 2014-06-04 DIAGNOSIS — M9903 Segmental and somatic dysfunction of lumbar region: Secondary | ICD-10-CM | POA: Diagnosis not present

## 2014-06-04 DIAGNOSIS — M9902 Segmental and somatic dysfunction of thoracic region: Secondary | ICD-10-CM | POA: Diagnosis not present

## 2014-06-04 DIAGNOSIS — M5430 Sciatica, unspecified side: Secondary | ICD-10-CM | POA: Diagnosis not present

## 2014-06-10 DIAGNOSIS — M9902 Segmental and somatic dysfunction of thoracic region: Secondary | ICD-10-CM | POA: Diagnosis not present

## 2014-06-10 DIAGNOSIS — M9903 Segmental and somatic dysfunction of lumbar region: Secondary | ICD-10-CM | POA: Diagnosis not present

## 2014-06-10 DIAGNOSIS — M5430 Sciatica, unspecified side: Secondary | ICD-10-CM | POA: Diagnosis not present

## 2014-06-10 DIAGNOSIS — M9904 Segmental and somatic dysfunction of sacral region: Secondary | ICD-10-CM | POA: Diagnosis not present

## 2014-06-17 DIAGNOSIS — M5430 Sciatica, unspecified side: Secondary | ICD-10-CM | POA: Diagnosis not present

## 2014-06-17 DIAGNOSIS — M9904 Segmental and somatic dysfunction of sacral region: Secondary | ICD-10-CM | POA: Diagnosis not present

## 2014-06-17 DIAGNOSIS — M9903 Segmental and somatic dysfunction of lumbar region: Secondary | ICD-10-CM | POA: Diagnosis not present

## 2014-06-17 DIAGNOSIS — M9902 Segmental and somatic dysfunction of thoracic region: Secondary | ICD-10-CM | POA: Diagnosis not present

## 2014-06-24 DIAGNOSIS — M5430 Sciatica, unspecified side: Secondary | ICD-10-CM | POA: Diagnosis not present

## 2014-06-24 DIAGNOSIS — M9903 Segmental and somatic dysfunction of lumbar region: Secondary | ICD-10-CM | POA: Diagnosis not present

## 2014-06-24 DIAGNOSIS — M9904 Segmental and somatic dysfunction of sacral region: Secondary | ICD-10-CM | POA: Diagnosis not present

## 2014-06-24 DIAGNOSIS — M9902 Segmental and somatic dysfunction of thoracic region: Secondary | ICD-10-CM | POA: Diagnosis not present

## 2014-06-29 DIAGNOSIS — M5430 Sciatica, unspecified side: Secondary | ICD-10-CM | POA: Diagnosis not present

## 2014-06-29 DIAGNOSIS — M9903 Segmental and somatic dysfunction of lumbar region: Secondary | ICD-10-CM | POA: Diagnosis not present

## 2014-06-29 DIAGNOSIS — M9902 Segmental and somatic dysfunction of thoracic region: Secondary | ICD-10-CM | POA: Diagnosis not present

## 2014-06-29 DIAGNOSIS — M9904 Segmental and somatic dysfunction of sacral region: Secondary | ICD-10-CM | POA: Diagnosis not present

## 2014-07-01 DIAGNOSIS — M9903 Segmental and somatic dysfunction of lumbar region: Secondary | ICD-10-CM | POA: Diagnosis not present

## 2014-07-01 DIAGNOSIS — M9902 Segmental and somatic dysfunction of thoracic region: Secondary | ICD-10-CM | POA: Diagnosis not present

## 2014-07-01 DIAGNOSIS — M9904 Segmental and somatic dysfunction of sacral region: Secondary | ICD-10-CM | POA: Diagnosis not present

## 2014-07-01 DIAGNOSIS — M5430 Sciatica, unspecified side: Secondary | ICD-10-CM | POA: Diagnosis not present

## 2014-07-04 DIAGNOSIS — I255 Ischemic cardiomyopathy: Secondary | ICD-10-CM | POA: Diagnosis not present

## 2014-07-04 DIAGNOSIS — R609 Edema, unspecified: Secondary | ICD-10-CM | POA: Diagnosis not present

## 2014-07-04 DIAGNOSIS — J209 Acute bronchitis, unspecified: Secondary | ICD-10-CM | POA: Diagnosis not present

## 2014-07-04 DIAGNOSIS — I482 Chronic atrial fibrillation: Secondary | ICD-10-CM | POA: Diagnosis not present

## 2014-07-04 DIAGNOSIS — J069 Acute upper respiratory infection, unspecified: Secondary | ICD-10-CM | POA: Diagnosis not present

## 2014-07-08 DIAGNOSIS — M5430 Sciatica, unspecified side: Secondary | ICD-10-CM | POA: Diagnosis not present

## 2014-07-08 DIAGNOSIS — M9903 Segmental and somatic dysfunction of lumbar region: Secondary | ICD-10-CM | POA: Diagnosis not present

## 2014-07-08 DIAGNOSIS — M9902 Segmental and somatic dysfunction of thoracic region: Secondary | ICD-10-CM | POA: Diagnosis not present

## 2014-07-08 DIAGNOSIS — M9904 Segmental and somatic dysfunction of sacral region: Secondary | ICD-10-CM | POA: Diagnosis not present

## 2014-07-13 DIAGNOSIS — M9903 Segmental and somatic dysfunction of lumbar region: Secondary | ICD-10-CM | POA: Diagnosis not present

## 2014-07-13 DIAGNOSIS — M5430 Sciatica, unspecified side: Secondary | ICD-10-CM | POA: Diagnosis not present

## 2014-07-13 DIAGNOSIS — M9902 Segmental and somatic dysfunction of thoracic region: Secondary | ICD-10-CM | POA: Diagnosis not present

## 2014-07-13 DIAGNOSIS — M9904 Segmental and somatic dysfunction of sacral region: Secondary | ICD-10-CM | POA: Diagnosis not present

## 2014-07-20 DIAGNOSIS — M9904 Segmental and somatic dysfunction of sacral region: Secondary | ICD-10-CM | POA: Diagnosis not present

## 2014-07-20 DIAGNOSIS — M9903 Segmental and somatic dysfunction of lumbar region: Secondary | ICD-10-CM | POA: Diagnosis not present

## 2014-07-20 DIAGNOSIS — M5430 Sciatica, unspecified side: Secondary | ICD-10-CM | POA: Diagnosis not present

## 2014-07-20 DIAGNOSIS — M9902 Segmental and somatic dysfunction of thoracic region: Secondary | ICD-10-CM | POA: Diagnosis not present

## 2014-07-21 DIAGNOSIS — E1142 Type 2 diabetes mellitus with diabetic polyneuropathy: Secondary | ICD-10-CM | POA: Diagnosis not present

## 2014-07-21 DIAGNOSIS — L84 Corns and callosities: Secondary | ICD-10-CM | POA: Diagnosis not present

## 2014-07-21 DIAGNOSIS — B351 Tinea unguium: Secondary | ICD-10-CM | POA: Diagnosis not present

## 2014-07-31 DIAGNOSIS — Z961 Presence of intraocular lens: Secondary | ICD-10-CM | POA: Diagnosis not present

## 2014-07-31 DIAGNOSIS — H4011X3 Primary open-angle glaucoma, severe stage: Secondary | ICD-10-CM | POA: Diagnosis not present

## 2014-07-31 DIAGNOSIS — H5340 Unspecified visual field defects: Secondary | ICD-10-CM | POA: Diagnosis not present

## 2014-08-10 DIAGNOSIS — H53452 Other localized visual field defect, left eye: Secondary | ICD-10-CM | POA: Diagnosis not present

## 2014-08-10 DIAGNOSIS — I638 Other cerebral infarction: Secondary | ICD-10-CM | POA: Diagnosis not present

## 2014-08-10 DIAGNOSIS — H5340 Unspecified visual field defects: Secondary | ICD-10-CM | POA: Diagnosis not present

## 2014-08-12 DIAGNOSIS — M5430 Sciatica, unspecified side: Secondary | ICD-10-CM | POA: Diagnosis not present

## 2014-08-12 DIAGNOSIS — M9904 Segmental and somatic dysfunction of sacral region: Secondary | ICD-10-CM | POA: Diagnosis not present

## 2014-08-12 DIAGNOSIS — M9902 Segmental and somatic dysfunction of thoracic region: Secondary | ICD-10-CM | POA: Diagnosis not present

## 2014-08-12 DIAGNOSIS — M9903 Segmental and somatic dysfunction of lumbar region: Secondary | ICD-10-CM | POA: Diagnosis not present

## 2014-08-17 DIAGNOSIS — E782 Mixed hyperlipidemia: Secondary | ICD-10-CM | POA: Diagnosis not present

## 2014-08-17 DIAGNOSIS — E1165 Type 2 diabetes mellitus with hyperglycemia: Secondary | ICD-10-CM | POA: Diagnosis not present

## 2014-08-17 DIAGNOSIS — G6289 Other specified polyneuropathies: Secondary | ICD-10-CM | POA: Diagnosis not present

## 2014-08-17 DIAGNOSIS — M9904 Segmental and somatic dysfunction of sacral region: Secondary | ICD-10-CM | POA: Diagnosis not present

## 2014-08-17 DIAGNOSIS — M9903 Segmental and somatic dysfunction of lumbar region: Secondary | ICD-10-CM | POA: Diagnosis not present

## 2014-08-17 DIAGNOSIS — I10 Essential (primary) hypertension: Secondary | ICD-10-CM | POA: Diagnosis not present

## 2014-08-17 DIAGNOSIS — M5430 Sciatica, unspecified side: Secondary | ICD-10-CM | POA: Diagnosis not present

## 2014-08-17 DIAGNOSIS — E6609 Other obesity due to excess calories: Secondary | ICD-10-CM | POA: Diagnosis not present

## 2014-08-17 DIAGNOSIS — M9902 Segmental and somatic dysfunction of thoracic region: Secondary | ICD-10-CM | POA: Diagnosis not present

## 2014-08-24 DIAGNOSIS — I240 Acute coronary thrombosis not resulting in myocardial infarction: Secondary | ICD-10-CM | POA: Diagnosis not present

## 2014-08-24 DIAGNOSIS — G6289 Other specified polyneuropathies: Secondary | ICD-10-CM | POA: Diagnosis not present

## 2014-08-24 DIAGNOSIS — I10 Essential (primary) hypertension: Secondary | ICD-10-CM | POA: Diagnosis not present

## 2014-08-24 DIAGNOSIS — E6609 Other obesity due to excess calories: Secondary | ICD-10-CM | POA: Diagnosis not present

## 2014-08-24 DIAGNOSIS — E782 Mixed hyperlipidemia: Secondary | ICD-10-CM | POA: Diagnosis not present

## 2014-08-26 DIAGNOSIS — M5430 Sciatica, unspecified side: Secondary | ICD-10-CM | POA: Diagnosis not present

## 2014-08-26 DIAGNOSIS — M9903 Segmental and somatic dysfunction of lumbar region: Secondary | ICD-10-CM | POA: Diagnosis not present

## 2014-08-26 DIAGNOSIS — M9902 Segmental and somatic dysfunction of thoracic region: Secondary | ICD-10-CM | POA: Diagnosis not present

## 2014-08-26 DIAGNOSIS — M9904 Segmental and somatic dysfunction of sacral region: Secondary | ICD-10-CM | POA: Diagnosis not present

## 2014-09-03 DIAGNOSIS — M9903 Segmental and somatic dysfunction of lumbar region: Secondary | ICD-10-CM | POA: Diagnosis not present

## 2014-09-03 DIAGNOSIS — M62838 Other muscle spasm: Secondary | ICD-10-CM | POA: Diagnosis not present

## 2014-09-03 DIAGNOSIS — M9902 Segmental and somatic dysfunction of thoracic region: Secondary | ICD-10-CM | POA: Diagnosis not present

## 2014-09-03 DIAGNOSIS — M9901 Segmental and somatic dysfunction of cervical region: Secondary | ICD-10-CM | POA: Diagnosis not present

## 2014-09-03 DIAGNOSIS — M9904 Segmental and somatic dysfunction of sacral region: Secondary | ICD-10-CM | POA: Diagnosis not present

## 2014-09-08 DIAGNOSIS — M9901 Segmental and somatic dysfunction of cervical region: Secondary | ICD-10-CM | POA: Diagnosis not present

## 2014-09-08 DIAGNOSIS — M9904 Segmental and somatic dysfunction of sacral region: Secondary | ICD-10-CM | POA: Diagnosis not present

## 2014-09-08 DIAGNOSIS — M9903 Segmental and somatic dysfunction of lumbar region: Secondary | ICD-10-CM | POA: Diagnosis not present

## 2014-09-08 DIAGNOSIS — M62838 Other muscle spasm: Secondary | ICD-10-CM | POA: Diagnosis not present

## 2014-09-08 DIAGNOSIS — M9902 Segmental and somatic dysfunction of thoracic region: Secondary | ICD-10-CM | POA: Diagnosis not present

## 2014-09-14 DIAGNOSIS — M9903 Segmental and somatic dysfunction of lumbar region: Secondary | ICD-10-CM | POA: Diagnosis not present

## 2014-09-14 DIAGNOSIS — M9904 Segmental and somatic dysfunction of sacral region: Secondary | ICD-10-CM | POA: Diagnosis not present

## 2014-09-14 DIAGNOSIS — M62838 Other muscle spasm: Secondary | ICD-10-CM | POA: Diagnosis not present

## 2014-09-14 DIAGNOSIS — M9901 Segmental and somatic dysfunction of cervical region: Secondary | ICD-10-CM | POA: Diagnosis not present

## 2014-09-14 DIAGNOSIS — M9902 Segmental and somatic dysfunction of thoracic region: Secondary | ICD-10-CM | POA: Diagnosis not present

## 2014-09-23 DIAGNOSIS — M9904 Segmental and somatic dysfunction of sacral region: Secondary | ICD-10-CM | POA: Diagnosis not present

## 2014-09-23 DIAGNOSIS — M9901 Segmental and somatic dysfunction of cervical region: Secondary | ICD-10-CM | POA: Diagnosis not present

## 2014-09-23 DIAGNOSIS — M9902 Segmental and somatic dysfunction of thoracic region: Secondary | ICD-10-CM | POA: Diagnosis not present

## 2014-09-23 DIAGNOSIS — M9903 Segmental and somatic dysfunction of lumbar region: Secondary | ICD-10-CM | POA: Diagnosis not present

## 2014-09-23 DIAGNOSIS — M62838 Other muscle spasm: Secondary | ICD-10-CM | POA: Diagnosis not present

## 2014-10-08 DIAGNOSIS — M9901 Segmental and somatic dysfunction of cervical region: Secondary | ICD-10-CM | POA: Diagnosis not present

## 2014-10-08 DIAGNOSIS — M9902 Segmental and somatic dysfunction of thoracic region: Secondary | ICD-10-CM | POA: Diagnosis not present

## 2014-10-08 DIAGNOSIS — M62838 Other muscle spasm: Secondary | ICD-10-CM | POA: Diagnosis not present

## 2014-10-08 DIAGNOSIS — M9903 Segmental and somatic dysfunction of lumbar region: Secondary | ICD-10-CM | POA: Diagnosis not present

## 2014-10-08 DIAGNOSIS — M9904 Segmental and somatic dysfunction of sacral region: Secondary | ICD-10-CM | POA: Diagnosis not present

## 2014-10-13 ENCOUNTER — Encounter: Payer: Self-pay | Admitting: *Deleted

## 2014-10-14 ENCOUNTER — Encounter: Payer: Self-pay | Admitting: Cardiology

## 2014-10-14 ENCOUNTER — Ambulatory Visit (INDEPENDENT_AMBULATORY_CARE_PROVIDER_SITE_OTHER): Payer: Medicare Other | Admitting: Cardiology

## 2014-10-14 VITALS — BP 120/70 | HR 73 | Ht 72.0 in | Wt 240.0 lb

## 2014-10-14 DIAGNOSIS — I482 Chronic atrial fibrillation, unspecified: Secondary | ICD-10-CM

## 2014-10-14 DIAGNOSIS — I5042 Chronic combined systolic (congestive) and diastolic (congestive) heart failure: Secondary | ICD-10-CM

## 2014-10-14 DIAGNOSIS — M9901 Segmental and somatic dysfunction of cervical region: Secondary | ICD-10-CM | POA: Diagnosis not present

## 2014-10-14 DIAGNOSIS — M9903 Segmental and somatic dysfunction of lumbar region: Secondary | ICD-10-CM | POA: Diagnosis not present

## 2014-10-14 DIAGNOSIS — Z7901 Long term (current) use of anticoagulants: Secondary | ICD-10-CM

## 2014-10-14 DIAGNOSIS — I2510 Atherosclerotic heart disease of native coronary artery without angina pectoris: Secondary | ICD-10-CM | POA: Diagnosis not present

## 2014-10-14 DIAGNOSIS — M9902 Segmental and somatic dysfunction of thoracic region: Secondary | ICD-10-CM | POA: Diagnosis not present

## 2014-10-14 DIAGNOSIS — I739 Peripheral vascular disease, unspecified: Secondary | ICD-10-CM

## 2014-10-14 DIAGNOSIS — M62838 Other muscle spasm: Secondary | ICD-10-CM | POA: Diagnosis not present

## 2014-10-14 DIAGNOSIS — I779 Disorder of arteries and arterioles, unspecified: Secondary | ICD-10-CM

## 2014-10-14 DIAGNOSIS — M9904 Segmental and somatic dysfunction of sacral region: Secondary | ICD-10-CM | POA: Diagnosis not present

## 2014-10-14 MED ORDER — RIVAROXABAN 20 MG PO TABS: 20.0000 mg | ORAL_TABLET | Freq: Every day | ORAL | Status: DC

## 2014-10-14 NOTE — Assessment & Plan Note (Signed)
His volume status is stable. No change in therapy. 

## 2014-10-14 NOTE — Assessment & Plan Note (Signed)
The patient has chronic atrial fibrillation. Rate is controlled. He is anticoagulated.

## 2014-10-14 NOTE — Patient Instructions (Signed)

## 2014-10-14 NOTE — Progress Notes (Signed)
Cardiology Office Note   Date:  10/14/2014   ID:  Walter [name], DOB 11-02-1931, MRN [id number]  PCP:  Gar Ponto, MD  Cardiologist:  Dola Argyle, MD   Chief Complaint  Patient presents with  . Appointment    Follow-up coronary artery disease      History of Present Illness: Walter [name] is a 81 y.o. male who presents today to follow-up coronary disease. He continues to be stable. He is not having any cardiac symptoms. He is bothered by his peripheral neuropathy.  Past Medical History  Diagnosis Date  . Ischemic cardiomyopathy     Ischemic  . Ejection fraction      EF 35-40%, echo, August 10, 2010  /      40-45% (better than before) Echo, January, 2010, ICD not indicated / EF 40%, echo, April, 2011  . CHF (congestive heart failure)     Systolic  . Hx of CABG     1998  . CAD (coronary artery disease)     Nuclear, August, 2005, extensive lateral scar, no ischemia /nuclear, August 09, 2010, large lateral and anterolateral scar with no ischemia, ejection fraction 26%  . Dyslipidemia   . Diabetes mellitus     Diagnosis April, 2011  . Hypertension   . GERD (gastroesophageal reflux disease)   . Anxiety   . Carotid artery disease     Dr. Scot Dock, Doppler, August, 6734, 19-37% LICA  . CVA (cerebral infarction)     April, 2011 likely cardioembolic  . Atrial fibrillation     Atrial fibrillation/flutter, rate control, Coumadin  . Chronic anticoagulation     Pradaxa  . Peripheral neuropathy   . Leg weakness     Arterial leg Dopplers normal, 2009  . Aortic valve sclerosis     No significant stenosis, echo  . Mitral regurgitation     Mild, echo, January, 2010  . PVC's (premature ventricular contractions)   . Positive D-dimer     D-dimer elevated, hospital, April, 2012, chest CT no pulmonary embolus    Past Surgical History  Procedure Laterality Date  . Coronary artery bypass graft  1998    Patient Active Problem List   Diagnosis Date Noted  . Chronic combined  systolic and diastolic CHF (congestive heart failure) 10/03/2013  . Ischemic cardiomyopathy   . Occlusion and stenosis of carotid artery without mention of cerebral infarction 12/22/2011  . Ejection fraction   . Hx of CABG   . CAD (coronary artery disease)   . Dyslipidemia   . Diabetes mellitus   . Hypertension   . GERD (gastroesophageal reflux disease)   . Anxiety   . Carotid artery disease   . CVA (cerebral infarction)   . Atrial fibrillation   . Chronic anticoagulation   . Peripheral neuropathy   . Leg weakness   . Aortic valve sclerosis   . Mitral regurgitation   . PVC's (premature ventricular contractions)   . Positive D-dimer       Current Outpatient Prescriptions  Medication Sig Dispense Refill  . ARTIFICIAL TEARS ophthalmic solution Place 1 drop into the left eye 2 (two) times daily.     . brimonidine (ALPHAGAN P) 0.1 % SOLN Place 1 drop into the right eye 2 (two) times daily.      . carvedilol (COREG) 25 MG tablet Take 25 mg by mouth 2 (two) times daily with a meal.      . diazepam (VALIUM) 5 MG tablet Take 2.5 mg by mouth  every 12 (twelve) hours as needed.      . furosemide (LASIX) 40 MG tablet Take 40 mg by mouth daily.      Marland Kitchen gabapentin (NEURONTIN) 300 MG capsule Take 300 mg by mouth 2 (two) times daily.      Marland Kitchen latanoprost (XALATAN) 0.005 % ophthalmic solution Place into both eyes at bedtime.    Marland Kitchen losartan (COZAAR) 100 MG tablet Take 100 mg by mouth daily.      . meclizine (ANTIVERT) 12.5 MG tablet Take 1 tablet by mouth Once daily as needed.    Marland Kitchen omeprazole (PRILOSEC OTC) 20 MG tablet Take 20 mg by mouth daily.      . rivaroxaban (XARELTO) 20 MG TABS tablet Take 1 tablet (20 mg total) by mouth daily with supper. 20 tablet 0  . simvastatin (ZOCOR) 40 MG tablet Take 20 mg by mouth at bedtime.     . Tamsulosin HCl (FLOMAX) 0.4 MG CAPS Take 0.4 mg by mouth at bedtime.       No current facility-administered medications for this visit.    Allergies:   Review of  patient's allergies indicates no known allergies.    Social History:  The patient  reports that he quit smoking about 32 years ago. His smoking use included Cigarettes. He has a 22.5 pack-year smoking history. He has never used smokeless tobacco. He reports that he does not drink alcohol or use illicit drugs.   Family History:  The patient's family history includes CAD in an other family member; Diabetes in an other family member; Hypertension in an other family member.    ROS:  Please see the history of present illness.     Patient denies fever, chills, headache, sweats, rash, change in vision, change in hearing, chest pain, cough, nausea or vomiting, urinary symptoms. All other systems are reviewed and are negative other than the history of present illness.  PHYSICAL EXAM: VS:  BP 120/70 mmHg  Pulse 73  Ht 6' (1.829 m)  Wt 240 lb (108.863 kg)  BMI 32.54 kg/m2  SpO2 96% , he is stable today. He walks with a cane for balance. He is oriented to person time and place. Affect is normal. Head is atraumatic. Sclera and conjunctiva are normal. There is no jugular venous distention. Lungs are clear. Respiratory effort is nonlabored. Cardiac exam reveals S1 and S2. Abdomen is soft. There is no peripheral edema. There are no musculoskeletal deformities. There are no skin rashes.  EKG:  EKG is done today. There is no change in the QRS. He has atrial fib with a controlled rate.    Recent Labs: No results found for requested labs within last 365 days.    Lipid Panel    Component Value Date/Time   CHOL  08/07/2009 0330    100        ATP III CLASSIFICATION:  <200     mg/dL   Desirable  200-239  mg/dL   Borderline High  >=240    mg/dL   High          TRIG 115 08/07/2009 0330   HDL 35* 08/07/2009 0330   CHOLHDL 2.9 08/07/2009 0330   VLDL 23 08/07/2009 0330   LDLCALC  08/07/2009 0330    42        Total Cholesterol/HDL:CHD Risk Coronary Heart Disease Risk Table                     Men    Women  1/2 Average Risk   3.4   3.3  Average Risk       5.0   4.4  2 X Average Risk   9.6   7.1  3 X Average Risk  23.4   11.0        Use the calculated Patient Ratio above and the CHD Risk Table to determine the patient's CHD Risk.        ATP III CLASSIFICATION (LDL):  <100     mg/dL   Optimal  100-129  mg/dL   Near or Above                    Optimal  130-159  mg/dL   Borderline  160-189  mg/dL   High  >190     mg/dL   Very High      Wt Readings from Last 3 Encounters:  10/14/14 240 lb (108.863 kg)  10/03/13 210 lb 12.8 oz (95.618 kg)  12/06/12 239 lb (108.41 kg)      Current medicines are reviewed  The patient understands his medications.     ASSESSMENT AND PLAN:

## 2014-10-14 NOTE — Assessment & Plan Note (Signed)
He has significant stenosis of the left internal carotid but it is been stable. It is time for his one-year follow-up.

## 2014-10-14 NOTE — Assessment & Plan Note (Signed)
Patient has been stable on Xarelto. I do not follow his labs. It will be important to watch his renal function to see if the dose ever needs to be reduced.

## 2014-10-14 NOTE — Assessment & Plan Note (Signed)
Coronary disease is stable. He had no ischemia by nuclear study 2012. He has no symptoms. No further workup.

## 2014-10-19 DIAGNOSIS — M9902 Segmental and somatic dysfunction of thoracic region: Secondary | ICD-10-CM | POA: Diagnosis not present

## 2014-10-19 DIAGNOSIS — M9903 Segmental and somatic dysfunction of lumbar region: Secondary | ICD-10-CM | POA: Diagnosis not present

## 2014-10-19 DIAGNOSIS — M9901 Segmental and somatic dysfunction of cervical region: Secondary | ICD-10-CM | POA: Diagnosis not present

## 2014-10-19 DIAGNOSIS — M9904 Segmental and somatic dysfunction of sacral region: Secondary | ICD-10-CM | POA: Diagnosis not present

## 2014-10-19 DIAGNOSIS — M62838 Other muscle spasm: Secondary | ICD-10-CM | POA: Diagnosis not present

## 2014-10-26 ENCOUNTER — Other Ambulatory Visit: Payer: Self-pay

## 2014-10-26 DIAGNOSIS — M62838 Other muscle spasm: Secondary | ICD-10-CM | POA: Diagnosis not present

## 2014-10-26 DIAGNOSIS — M9901 Segmental and somatic dysfunction of cervical region: Secondary | ICD-10-CM | POA: Diagnosis not present

## 2014-10-26 DIAGNOSIS — M9904 Segmental and somatic dysfunction of sacral region: Secondary | ICD-10-CM | POA: Diagnosis not present

## 2014-10-26 DIAGNOSIS — M9902 Segmental and somatic dysfunction of thoracic region: Secondary | ICD-10-CM | POA: Diagnosis not present

## 2014-10-26 DIAGNOSIS — M9903 Segmental and somatic dysfunction of lumbar region: Secondary | ICD-10-CM | POA: Diagnosis not present

## 2014-10-29 ENCOUNTER — Ambulatory Visit (INDEPENDENT_AMBULATORY_CARE_PROVIDER_SITE_OTHER): Payer: Medicare Other

## 2014-10-29 DIAGNOSIS — I779 Disorder of arteries and arterioles, unspecified: Secondary | ICD-10-CM

## 2014-10-29 DIAGNOSIS — I739 Peripheral vascular disease, unspecified: Principal | ICD-10-CM

## 2014-11-05 DIAGNOSIS — L84 Corns and callosities: Secondary | ICD-10-CM | POA: Diagnosis not present

## 2014-11-05 DIAGNOSIS — B351 Tinea unguium: Secondary | ICD-10-CM | POA: Diagnosis not present

## 2014-11-05 DIAGNOSIS — E1142 Type 2 diabetes mellitus with diabetic polyneuropathy: Secondary | ICD-10-CM | POA: Diagnosis not present

## 2014-11-06 ENCOUNTER — Telehealth: Payer: Self-pay | Admitting: *Deleted

## 2014-11-06 NOTE — Telephone Encounter (Signed)
Notes Recorded by Carlena Bjornstad, MD on 11/04/2014 at 7:35 AM Please notify patient that Doppler is stable. There is been no significant change. Plan follow-up in 1 year.

## 2014-11-06 NOTE — Telephone Encounter (Signed)
Wife informed

## 2014-11-09 DIAGNOSIS — X32XXXD Exposure to sunlight, subsequent encounter: Secondary | ICD-10-CM | POA: Diagnosis not present

## 2014-11-09 DIAGNOSIS — Z1283 Encounter for screening for malignant neoplasm of skin: Secondary | ICD-10-CM | POA: Diagnosis not present

## 2014-11-09 DIAGNOSIS — L570 Actinic keratosis: Secondary | ICD-10-CM | POA: Diagnosis not present

## 2014-11-30 DIAGNOSIS — E782 Mixed hyperlipidemia: Secondary | ICD-10-CM | POA: Diagnosis not present

## 2014-11-30 DIAGNOSIS — E1165 Type 2 diabetes mellitus with hyperglycemia: Secondary | ICD-10-CM | POA: Diagnosis not present

## 2014-11-30 DIAGNOSIS — I10 Essential (primary) hypertension: Secondary | ICD-10-CM | POA: Diagnosis not present

## 2014-11-30 DIAGNOSIS — I482 Chronic atrial fibrillation: Secondary | ICD-10-CM | POA: Diagnosis not present

## 2014-12-07 DIAGNOSIS — E6609 Other obesity due to excess calories: Secondary | ICD-10-CM | POA: Diagnosis not present

## 2014-12-07 DIAGNOSIS — I5032 Chronic diastolic (congestive) heart failure: Secondary | ICD-10-CM | POA: Diagnosis not present

## 2014-12-07 DIAGNOSIS — I255 Ischemic cardiomyopathy: Secondary | ICD-10-CM | POA: Diagnosis not present

## 2014-12-07 DIAGNOSIS — I482 Chronic atrial fibrillation: Secondary | ICD-10-CM | POA: Diagnosis not present

## 2014-12-07 DIAGNOSIS — I10 Essential (primary) hypertension: Secondary | ICD-10-CM | POA: Diagnosis not present

## 2014-12-15 DIAGNOSIS — H4011X3 Primary open-angle glaucoma, severe stage: Secondary | ICD-10-CM | POA: Diagnosis not present

## 2014-12-15 DIAGNOSIS — Z961 Presence of intraocular lens: Secondary | ICD-10-CM | POA: Diagnosis not present

## 2014-12-15 DIAGNOSIS — H524 Presbyopia: Secondary | ICD-10-CM | POA: Diagnosis not present

## 2014-12-15 DIAGNOSIS — H5340 Unspecified visual field defects: Secondary | ICD-10-CM | POA: Diagnosis not present

## 2015-02-08 DIAGNOSIS — L304 Erythema intertrigo: Secondary | ICD-10-CM | POA: Diagnosis not present

## 2015-02-08 DIAGNOSIS — Z23 Encounter for immunization: Secondary | ICD-10-CM | POA: Diagnosis not present

## 2015-02-23 DIAGNOSIS — L84 Corns and callosities: Secondary | ICD-10-CM | POA: Diagnosis not present

## 2015-02-23 DIAGNOSIS — E1142 Type 2 diabetes mellitus with diabetic polyneuropathy: Secondary | ICD-10-CM | POA: Diagnosis not present

## 2015-02-23 DIAGNOSIS — B351 Tinea unguium: Secondary | ICD-10-CM | POA: Diagnosis not present

## 2015-03-26 DIAGNOSIS — B372 Candidiasis of skin and nail: Secondary | ICD-10-CM | POA: Diagnosis not present

## 2015-05-05 DIAGNOSIS — E1165 Type 2 diabetes mellitus with hyperglycemia: Secondary | ICD-10-CM | POA: Diagnosis not present

## 2015-05-05 DIAGNOSIS — I10 Essential (primary) hypertension: Secondary | ICD-10-CM | POA: Diagnosis not present

## 2015-05-05 DIAGNOSIS — I482 Chronic atrial fibrillation: Secondary | ICD-10-CM | POA: Diagnosis not present

## 2015-05-05 DIAGNOSIS — K210 Gastro-esophageal reflux disease with esophagitis: Secondary | ICD-10-CM | POA: Diagnosis not present

## 2015-05-05 DIAGNOSIS — E782 Mixed hyperlipidemia: Secondary | ICD-10-CM | POA: Diagnosis not present

## 2015-06-17 DIAGNOSIS — I10 Essential (primary) hypertension: Secondary | ICD-10-CM | POA: Diagnosis not present

## 2015-06-17 DIAGNOSIS — Z0001 Encounter for general adult medical examination with abnormal findings: Secondary | ICD-10-CM | POA: Diagnosis not present

## 2015-06-17 DIAGNOSIS — E782 Mixed hyperlipidemia: Secondary | ICD-10-CM | POA: Diagnosis not present

## 2015-06-17 DIAGNOSIS — G6289 Other specified polyneuropathies: Secondary | ICD-10-CM | POA: Diagnosis not present

## 2015-06-21 DIAGNOSIS — H401412 Capsular glaucoma with pseudoexfoliation of lens, right eye, moderate stage: Secondary | ICD-10-CM | POA: Diagnosis not present

## 2015-06-21 DIAGNOSIS — H401423 Capsular glaucoma with pseudoexfoliation of lens, left eye, severe stage: Secondary | ICD-10-CM | POA: Diagnosis not present

## 2015-07-20 DIAGNOSIS — E1142 Type 2 diabetes mellitus with diabetic polyneuropathy: Secondary | ICD-10-CM | POA: Diagnosis not present

## 2015-07-20 DIAGNOSIS — B351 Tinea unguium: Secondary | ICD-10-CM | POA: Diagnosis not present

## 2015-07-27 DIAGNOSIS — L290 Pruritus ani: Secondary | ICD-10-CM | POA: Diagnosis not present

## 2015-07-31 DIAGNOSIS — R0602 Shortness of breath: Secondary | ICD-10-CM | POA: Diagnosis not present

## 2015-07-31 DIAGNOSIS — J301 Allergic rhinitis due to pollen: Secondary | ICD-10-CM | POA: Diagnosis not present

## 2015-07-31 DIAGNOSIS — I5032 Chronic diastolic (congestive) heart failure: Secondary | ICD-10-CM | POA: Diagnosis not present

## 2015-07-31 DIAGNOSIS — I482 Chronic atrial fibrillation: Secondary | ICD-10-CM | POA: Diagnosis not present

## 2015-08-03 DIAGNOSIS — K589 Irritable bowel syndrome without diarrhea: Secondary | ICD-10-CM | POA: Diagnosis not present

## 2015-08-11 ENCOUNTER — Other Ambulatory Visit (HOSPITAL_COMMUNITY): Payer: Self-pay | Admitting: Family Medicine

## 2015-08-11 DIAGNOSIS — R1013 Epigastric pain: Secondary | ICD-10-CM

## 2015-08-12 ENCOUNTER — Ambulatory Visit (HOSPITAL_COMMUNITY)
Admission: RE | Admit: 2015-08-12 | Discharge: 2015-08-12 | Disposition: A | Discharge status: Home / Self Care | Payer: Medicare Other | Source: Ambulatory Visit | Attending: Family Medicine | Admitting: Family Medicine

## 2015-08-12 DIAGNOSIS — J90 Pleural effusion, not elsewhere classified: Secondary | ICD-10-CM | POA: Diagnosis not present

## 2015-08-12 DIAGNOSIS — R932 Abnormal findings on diagnostic imaging of liver and biliary tract: Secondary | ICD-10-CM | POA: Insufficient documentation

## 2015-08-12 DIAGNOSIS — R1013 Epigastric pain: Secondary | ICD-10-CM | POA: Insufficient documentation

## 2015-08-12 DIAGNOSIS — N281 Cyst of kidney, acquired: Secondary | ICD-10-CM | POA: Diagnosis not present

## 2015-08-12 DIAGNOSIS — B9681 Helicobacter pylori [H. pylori] as the cause of diseases classified elsewhere: Secondary | ICD-10-CM | POA: Diagnosis not present

## 2015-08-12 DIAGNOSIS — I5032 Chronic diastolic (congestive) heart failure: Secondary | ICD-10-CM | POA: Diagnosis not present

## 2015-09-08 DIAGNOSIS — I10 Essential (primary) hypertension: Secondary | ICD-10-CM | POA: Diagnosis not present

## 2015-09-08 DIAGNOSIS — I5032 Chronic diastolic (congestive) heart failure: Secondary | ICD-10-CM | POA: Diagnosis not present

## 2015-09-08 DIAGNOSIS — I482 Chronic atrial fibrillation: Secondary | ICD-10-CM | POA: Diagnosis not present

## 2015-09-08 DIAGNOSIS — E1165 Type 2 diabetes mellitus with hyperglycemia: Secondary | ICD-10-CM | POA: Diagnosis not present

## 2015-09-08 DIAGNOSIS — E782 Mixed hyperlipidemia: Secondary | ICD-10-CM | POA: Diagnosis not present

## 2015-09-13 DIAGNOSIS — G6289 Other specified polyneuropathies: Secondary | ICD-10-CM | POA: Diagnosis not present

## 2015-09-13 DIAGNOSIS — I10 Essential (primary) hypertension: Secondary | ICD-10-CM | POA: Diagnosis not present

## 2015-09-13 DIAGNOSIS — I25111 Atherosclerotic heart disease of native coronary artery with angina pectoris with documented spasm: Secondary | ICD-10-CM | POA: Diagnosis not present

## 2015-09-13 DIAGNOSIS — E782 Mixed hyperlipidemia: Secondary | ICD-10-CM | POA: Diagnosis not present

## 2015-09-13 DIAGNOSIS — Z1212 Encounter for screening for malignant neoplasm of rectum: Secondary | ICD-10-CM | POA: Diagnosis not present

## 2015-10-14 ENCOUNTER — Encounter: Payer: Self-pay | Admitting: Cardiology

## 2015-10-14 ENCOUNTER — Ambulatory Visit (INDEPENDENT_AMBULATORY_CARE_PROVIDER_SITE_OTHER): Payer: Medicare Other | Admitting: Cardiology

## 2015-10-14 ENCOUNTER — Encounter: Payer: Self-pay | Admitting: *Deleted

## 2015-10-14 VITALS — BP 110/68 | HR 68 | Ht 72.0 in | Wt 232.0 lb

## 2015-10-14 DIAGNOSIS — I6529 Occlusion and stenosis of unspecified carotid artery: Secondary | ICD-10-CM | POA: Diagnosis not present

## 2015-10-14 DIAGNOSIS — I5022 Chronic systolic (congestive) heart failure: Secondary | ICD-10-CM

## 2015-10-14 DIAGNOSIS — I2510 Atherosclerotic heart disease of native coronary artery without angina pectoris: Secondary | ICD-10-CM | POA: Diagnosis not present

## 2015-10-14 DIAGNOSIS — I4891 Unspecified atrial fibrillation: Secondary | ICD-10-CM | POA: Diagnosis not present

## 2015-10-14 MED ORDER — RIVAROXABAN 15 MG PO TABS: 15.0000 mg | ORAL_TABLET | Freq: Every day | ORAL | Status: DC

## 2015-10-14 MED ORDER — RIVAROXABAN 20 MG PO TABS: 20.0000 mg | ORAL_TABLET | Freq: Every day | ORAL | Status: DC

## 2015-10-14 NOTE — Progress Notes (Signed)
Clinical Summary Walter [name] is a 82 y.o.male former patient of Dr Ron Parker, this is our first visit together. He is seen for the following medical problems.  1. CAD - history of prior CABG - nuclear stress 07/2010 with lateral and anterolateral scar, no active ischemia.  - denies any chest pain. No SOB or DOE - compliant with meds   2. Chronic systolic HF - LVEF 123456 by echo 07/2010 - echo 04/2011 LVEF 45-50%, abnormal diastolic function - no SOB or DOE. Occasional LE edema. Working to limit sodium intake.   3. Hyperlipidemia - compliant with statin - recent panel with pcp  4. HTN - does not check at home.   5. Afib - occasional palpitations overall infrequent - compliant with meds. Denies any bleeding issues on xarelto.    6. Carotid stenosis - 09/2014 carotid US with RICA 123456, LICA [id number].  Past Medical History  Diagnosis Date  . Ischemic cardiomyopathy     Ischemic  . Ejection fraction      EF 35-40%, echo, August 10, 2010  /      40-45% (better than before) Echo, January, 2010, ICD not indicated / EF 40%, echo, April, 2011  . CHF (congestive heart failure)     Systolic  . Hx of CABG     1998  . CAD (coronary artery disease)     Nuclear, August, 2005, extensive lateral scar, no ischemia /nuclear, August 09, 2010, large lateral and anterolateral scar with no ischemia, ejection fraction 26%  . Dyslipidemia   . Diabetes mellitus     Diagnosis April, 2011  . Hypertension   . GERD (gastroesophageal reflux disease)   . Anxiety   . Carotid artery disease     Dr. Scot Dock, Doppler, August, [id number], [id number] LICA  . CVA (cerebral infarction)     April, 2011 likely cardioembolic  . Atrial fibrillation     Atrial fibrillation/flutter, rate control, Coumadin  . Chronic anticoagulation     Pradaxa  . Peripheral neuropathy   . Leg weakness     Arterial leg Dopplers normal, 2009  . Aortic valve sclerosis     No significant stenosis, echo  . Mitral regurgitation     Mild,  echo, January, 2010  . PVC's (premature ventricular contractions)   . Positive D-dimer     D-dimer elevated, hospital, April, 2012, chest CT no pulmonary embolus     No Known Allergies   Current Outpatient Prescriptions  Medication Sig Dispense Refill  . ARTIFICIAL TEARS ophthalmic solution Place 1 drop into the left eye 2 (two) times daily.     . brimonidine (ALPHAGAN P) 0.1 % SOLN Place 1 drop into the right eye 2 (two) times daily.      . carvedilol (COREG) 25 MG tablet Take 25 mg by mouth 2 (two) times daily with a meal.      . diazepam (VALIUM) 5 MG tablet Take 2.5 mg by mouth every 12 (twelve) hours as needed.      . furosemide (LASIX) 40 MG tablet Take 40 mg by mouth daily.      Marland Kitchen gabapentin (NEURONTIN) 300 MG capsule Take 300 mg by mouth 2 (two) times daily.      Marland Kitchen latanoprost (XALATAN) 0.005 % ophthalmic solution Place into both eyes at bedtime.    Marland Kitchen losartan (COZAAR) 100 MG tablet Take 100 mg by mouth daily.      . meclizine (ANTIVERT) 12.5 MG tablet Take 1 tablet by mouth Once daily  as needed.    Marland Kitchen omeprazole (PRILOSEC OTC) 20 MG tablet Take 20 mg by mouth daily.      . rivaroxaban (XARELTO) 20 MG TABS tablet Take 1 tablet (20 mg total) by mouth daily with supper. 20 tablet 0  . simvastatin (ZOCOR) 40 MG tablet Take 20 mg by mouth at bedtime.     . Tamsulosin HCl (FLOMAX) 0.4 MG CAPS Take 0.4 mg by mouth at bedtime.       No current facility-administered medications for this visit.     Past Surgical History  Procedure Laterality Date  . Coronary artery bypass graft  1998     No Known Allergies    Family History  Problem Relation Age of Onset  . Hypertension    . Diabetes    . CAD       Social History Walter [name] reports that he quit smoking about 33 years ago. His smoking use included Cigarettes. He has a 22.5 pack-year smoking history. He has never used smokeless tobacco. Walter [name] reports that he does not drink alcohol.   Review of  Systems CONSTITUTIONAL: No weight loss, fever, chills, weakness or fatigue.  HEENT: Eyes: No visual loss, blurred vision, double vision or yellow sclerae.No hearing loss, sneezing, congestion, runny nose or sore throat.  SKIN: No rash or itching.  CARDIOVASCULAR: per HPI RESPIRATORY: No shortness of breath, cough or sputum.  GASTROINTESTINAL: No anorexia, nausea, vomiting or diarrhea. No abdominal pain or blood.  GENITOURINARY: No burning on urination, no polyuria NEUROLOGICAL: No headache, dizziness, syncope, paralysis, ataxia, numbness or tingling in the extremities. No change in bowel or bladder control.  MUSCULOSKELETAL: No muscle, back pain, joint pain or stiffness.  LYMPHATICS: No enlarged nodes. No history of splenectomy.  PSYCHIATRIC: No history of depression or anxiety.  ENDOCRINOLOGIC: No reports of sweating, cold or heat intolerance. No polyuria or polydipsia.  Marland Kitchen   Physical Examination Filed Vitals:   10/14/15 1106  BP: 110/68  Pulse: 68   Filed Vitals:   10/14/15 1106  Height: 6' (1.829 m)  Weight: 232 lb (105.235 kg)    Gen: resting comfortably, no acute distress HEENT: no scleral icterus, pupils equal round and reactive, no palptable cervical adenopathy,  CV: RRR, no m/r/g, no jvd. Bilateral carotid bruits Resp: Clear to auscultation bilaterally GI: abdomen is soft, non-tender, non-distended, normal bowel sounds, no hepatosplenomegaly MSK: extremities are warm, no edema.  Skin: warm, no rash Neuro:  no focal deficits Psych: appropriate affect     Assessment and Plan  1. CAD - no recent symptoms, continue current meds  2. Chronic systolic HF - mildly decreased to low normal LVEF by last echo - denies any recent symptoms -continue current meds  3. Carotid stenosis - repeat carotid US  4. Hyperlipidemia - request labs from pcp - continue staitn  5. HTN - at goal, continue current meds  6. Afib - no current symptoms - EKG in clnic shows rate  controlled afib - continue current meds. CHADS2Vasc score is 5, continue anticoagulation   F/u 1 year   Arnoldo Lenis, M.D.

## 2015-10-14 NOTE — Progress Notes (Signed)
Pt was not given 20 mg of Xarelto samples -  15 mg samples given - see log and medicine hx. Disregard 20 mg samples they were not given entered before med change

## 2015-10-14 NOTE — Patient Instructions (Addendum)
Your physician wants you to follow-up in: [place] will receive a reminder letter in the mail two months in advance. If you don't receive a letter, please call our office to schedule the follow-up appointment.  Your physician recommends that you continue on your current medications as directed. Please refer to the Current Medication list given to you today.  Your physician has requested that you have a carotid duplex. This test is an ultrasound of the carotid arteries in your neck. It looks at blood flow through these arteries that supply the brain with blood. Allow one hour for this exam. There are no restrictions or special instructions.  Thank you for choosing [facility]!!

## 2015-10-15 ENCOUNTER — Encounter: Payer: Self-pay | Admitting: Cardiology

## 2015-10-20 ENCOUNTER — Ambulatory Visit (INDEPENDENT_AMBULATORY_CARE_PROVIDER_SITE_OTHER): Payer: Medicare Other

## 2015-10-20 DIAGNOSIS — I6529 Occlusion and stenosis of unspecified carotid artery: Secondary | ICD-10-CM

## 2015-10-20 DIAGNOSIS — I6523 Occlusion and stenosis of bilateral carotid arteries: Secondary | ICD-10-CM

## 2015-10-20 LAB — VAS US CAROTID
LCCADDIAS: -9 cm/s
LCCADSYS: -39 cm/s
LEFT ECA DIAS: -17 cm/s
LEFT VERTEBRAL DIAS: -10 cm/s
LICADSYS: -67 cm/s
Left CCA prox dias: 12 cm/s
Left CCA prox sys: 72 cm/s
Left ICA dist dias: -19 cm/s
Left ICA prox dias: -38 cm/s
Left ICA prox sys: -147 cm/s
RCCADSYS: -64 cm/s
RCCAPSYS: 68 cm/s
RIGHT ECA DIAS: -12 cm/s
RIGHT VERTEBRAL DIAS: 9 cm/s
Right CCA prox dias: 11 cm/s

## 2015-10-29 ENCOUNTER — Encounter: Payer: Self-pay | Admitting: *Deleted

## 2015-10-29 ENCOUNTER — Telehealth: Payer: Self-pay | Admitting: *Deleted

## 2015-10-29 NOTE — Telephone Encounter (Signed)
LM X3 days mailed pt letter

## 2015-10-29 NOTE — Telephone Encounter (Signed)
-----   Message from Arnoldo Lenis, MD sent at 10/22/2015  2:06 PM EDT ----- Carotid US shows mild to moderate blockages on both sides, nothing new to do, we will continue to monitor  Zandra Abts MD

## 2015-10-30 DIAGNOSIS — H409 Unspecified glaucoma: Secondary | ICD-10-CM | POA: Diagnosis not present

## 2015-10-30 DIAGNOSIS — I509 Heart failure, unspecified: Secondary | ICD-10-CM | POA: Diagnosis not present

## 2015-10-30 DIAGNOSIS — R58 Hemorrhage, not elsewhere classified: Secondary | ICD-10-CM | POA: Diagnosis not present

## 2015-10-30 DIAGNOSIS — Z87891 Personal history of nicotine dependence: Secondary | ICD-10-CM | POA: Diagnosis not present

## 2015-10-30 DIAGNOSIS — E7800 Pure hypercholesterolemia, unspecified: Secondary | ICD-10-CM | POA: Diagnosis not present

## 2015-10-30 DIAGNOSIS — K219 Gastro-esophageal reflux disease without esophagitis: Secondary | ICD-10-CM | POA: Diagnosis not present

## 2015-10-30 DIAGNOSIS — N400 Enlarged prostate without lower urinary tract symptoms: Secondary | ICD-10-CM | POA: Diagnosis not present

## 2015-10-30 DIAGNOSIS — Z79899 Other long term (current) drug therapy: Secondary | ICD-10-CM | POA: Diagnosis not present

## 2015-10-30 DIAGNOSIS — I10 Essential (primary) hypertension: Secondary | ICD-10-CM | POA: Diagnosis not present

## 2015-10-30 DIAGNOSIS — I110 Hypertensive heart disease with heart failure: Secondary | ICD-10-CM | POA: Diagnosis not present

## 2015-10-30 DIAGNOSIS — E1140 Type 2 diabetes mellitus with diabetic neuropathy, unspecified: Secondary | ICD-10-CM | POA: Diagnosis not present

## 2015-10-30 DIAGNOSIS — I482 Chronic atrial fibrillation: Secondary | ICD-10-CM | POA: Diagnosis not present

## 2015-10-30 DIAGNOSIS — S01512A Laceration without foreign body of oral cavity, initial encounter: Secondary | ICD-10-CM | POA: Diagnosis not present

## 2015-10-30 DIAGNOSIS — I255 Ischemic cardiomyopathy: Secondary | ICD-10-CM | POA: Diagnosis not present

## 2015-10-30 DIAGNOSIS — R2689 Other abnormalities of gait and mobility: Secondary | ICD-10-CM | POA: Diagnosis not present

## 2015-10-30 DIAGNOSIS — K068 Other specified disorders of gingiva and edentulous alveolar ridge: Secondary | ICD-10-CM | POA: Diagnosis not present

## 2015-10-30 DIAGNOSIS — Z7901 Long term (current) use of anticoagulants: Secondary | ICD-10-CM | POA: Diagnosis not present

## 2015-10-30 DIAGNOSIS — K1379 Other lesions of oral mucosa: Secondary | ICD-10-CM | POA: Diagnosis not present

## 2015-11-04 DIAGNOSIS — L570 Actinic keratosis: Secondary | ICD-10-CM | POA: Diagnosis not present

## 2015-11-04 DIAGNOSIS — D225 Melanocytic nevi of trunk: Secondary | ICD-10-CM | POA: Diagnosis not present

## 2015-11-04 DIAGNOSIS — Z1283 Encounter for screening for malignant neoplasm of skin: Secondary | ICD-10-CM | POA: Diagnosis not present

## 2015-11-05 DIAGNOSIS — S01512A Laceration without foreign body of oral cavity, initial encounter: Secondary | ICD-10-CM | POA: Diagnosis not present

## 2015-11-05 DIAGNOSIS — R2681 Unsteadiness on feet: Secondary | ICD-10-CM | POA: Diagnosis not present

## 2015-12-02 DIAGNOSIS — R42 Dizziness and giddiness: Secondary | ICD-10-CM | POA: Diagnosis not present

## 2015-12-02 DIAGNOSIS — R296 Repeated falls: Secondary | ICD-10-CM | POA: Diagnosis not present

## 2015-12-04 DIAGNOSIS — R296 Repeated falls: Secondary | ICD-10-CM | POA: Diagnosis not present

## 2015-12-04 DIAGNOSIS — R42 Dizziness and giddiness: Secondary | ICD-10-CM | POA: Diagnosis not present

## 2015-12-08 DIAGNOSIS — R42 Dizziness and giddiness: Secondary | ICD-10-CM | POA: Diagnosis not present

## 2015-12-08 DIAGNOSIS — I7389 Other specified peripheral vascular diseases: Secondary | ICD-10-CM | POA: Diagnosis not present

## 2015-12-08 DIAGNOSIS — R55 Syncope and collapse: Secondary | ICD-10-CM | POA: Diagnosis not present

## 2015-12-08 DIAGNOSIS — I6782 Cerebral ischemia: Secondary | ICD-10-CM | POA: Diagnosis not present

## 2015-12-08 DIAGNOSIS — R296 Repeated falls: Secondary | ICD-10-CM | POA: Diagnosis not present

## 2015-12-15 DIAGNOSIS — Z9181 History of falling: Secondary | ICD-10-CM | POA: Diagnosis not present

## 2015-12-15 DIAGNOSIS — K219 Gastro-esophageal reflux disease without esophagitis: Secondary | ICD-10-CM | POA: Diagnosis not present

## 2015-12-15 DIAGNOSIS — R2681 Unsteadiness on feet: Secondary | ICD-10-CM | POA: Diagnosis not present

## 2015-12-15 DIAGNOSIS — Z951 Presence of aortocoronary bypass graft: Secondary | ICD-10-CM | POA: Diagnosis not present

## 2015-12-15 DIAGNOSIS — M6281 Muscle weakness (generalized): Secondary | ICD-10-CM | POA: Diagnosis not present

## 2015-12-15 DIAGNOSIS — I10 Essential (primary) hypertension: Secondary | ICD-10-CM | POA: Diagnosis not present

## 2015-12-15 DIAGNOSIS — E785 Hyperlipidemia, unspecified: Secondary | ICD-10-CM | POA: Diagnosis not present

## 2015-12-15 DIAGNOSIS — E119 Type 2 diabetes mellitus without complications: Secondary | ICD-10-CM | POA: Diagnosis not present

## 2015-12-17 DIAGNOSIS — M6281 Muscle weakness (generalized): Secondary | ICD-10-CM | POA: Diagnosis not present

## 2015-12-17 DIAGNOSIS — K219 Gastro-esophageal reflux disease without esophagitis: Secondary | ICD-10-CM | POA: Diagnosis not present

## 2015-12-17 DIAGNOSIS — E119 Type 2 diabetes mellitus without complications: Secondary | ICD-10-CM | POA: Diagnosis not present

## 2015-12-17 DIAGNOSIS — Z9181 History of falling: Secondary | ICD-10-CM | POA: Diagnosis not present

## 2015-12-17 DIAGNOSIS — Z951 Presence of aortocoronary bypass graft: Secondary | ICD-10-CM | POA: Diagnosis not present

## 2015-12-17 DIAGNOSIS — I10 Essential (primary) hypertension: Secondary | ICD-10-CM | POA: Diagnosis not present

## 2015-12-17 DIAGNOSIS — E785 Hyperlipidemia, unspecified: Secondary | ICD-10-CM | POA: Diagnosis not present

## 2015-12-17 DIAGNOSIS — E782 Mixed hyperlipidemia: Secondary | ICD-10-CM | POA: Diagnosis not present

## 2015-12-17 DIAGNOSIS — E1165 Type 2 diabetes mellitus with hyperglycemia: Secondary | ICD-10-CM | POA: Diagnosis not present

## 2015-12-17 DIAGNOSIS — K210 Gastro-esophageal reflux disease with esophagitis: Secondary | ICD-10-CM | POA: Diagnosis not present

## 2015-12-17 DIAGNOSIS — R2681 Unsteadiness on feet: Secondary | ICD-10-CM | POA: Diagnosis not present

## 2015-12-20 DIAGNOSIS — I10 Essential (primary) hypertension: Secondary | ICD-10-CM | POA: Diagnosis not present

## 2015-12-20 DIAGNOSIS — I25111 Atherosclerotic heart disease of native coronary artery with angina pectoris with documented spasm: Secondary | ICD-10-CM | POA: Diagnosis not present

## 2015-12-20 DIAGNOSIS — E782 Mixed hyperlipidemia: Secondary | ICD-10-CM | POA: Diagnosis not present

## 2015-12-20 DIAGNOSIS — G6289 Other specified polyneuropathies: Secondary | ICD-10-CM | POA: Diagnosis not present

## 2015-12-21 DIAGNOSIS — I10 Essential (primary) hypertension: Secondary | ICD-10-CM | POA: Diagnosis not present

## 2015-12-21 DIAGNOSIS — Z9181 History of falling: Secondary | ICD-10-CM | POA: Diagnosis not present

## 2015-12-21 DIAGNOSIS — Z951 Presence of aortocoronary bypass graft: Secondary | ICD-10-CM | POA: Diagnosis not present

## 2015-12-21 DIAGNOSIS — E119 Type 2 diabetes mellitus without complications: Secondary | ICD-10-CM | POA: Diagnosis not present

## 2015-12-21 DIAGNOSIS — M6281 Muscle weakness (generalized): Secondary | ICD-10-CM | POA: Diagnosis not present

## 2015-12-21 DIAGNOSIS — E785 Hyperlipidemia, unspecified: Secondary | ICD-10-CM | POA: Diagnosis not present

## 2015-12-21 DIAGNOSIS — K219 Gastro-esophageal reflux disease without esophagitis: Secondary | ICD-10-CM | POA: Diagnosis not present

## 2015-12-21 DIAGNOSIS — R2681 Unsteadiness on feet: Secondary | ICD-10-CM | POA: Diagnosis not present

## 2015-12-22 DIAGNOSIS — E119 Type 2 diabetes mellitus without complications: Secondary | ICD-10-CM | POA: Diagnosis not present

## 2015-12-22 DIAGNOSIS — Z951 Presence of aortocoronary bypass graft: Secondary | ICD-10-CM | POA: Diagnosis not present

## 2015-12-22 DIAGNOSIS — K219 Gastro-esophageal reflux disease without esophagitis: Secondary | ICD-10-CM | POA: Diagnosis not present

## 2015-12-22 DIAGNOSIS — R2681 Unsteadiness on feet: Secondary | ICD-10-CM | POA: Diagnosis not present

## 2015-12-22 DIAGNOSIS — Z9181 History of falling: Secondary | ICD-10-CM | POA: Diagnosis not present

## 2015-12-22 DIAGNOSIS — E785 Hyperlipidemia, unspecified: Secondary | ICD-10-CM | POA: Diagnosis not present

## 2015-12-22 DIAGNOSIS — I10 Essential (primary) hypertension: Secondary | ICD-10-CM | POA: Diagnosis not present

## 2015-12-22 DIAGNOSIS — M6281 Muscle weakness (generalized): Secondary | ICD-10-CM | POA: Diagnosis not present

## 2015-12-27 DIAGNOSIS — R2681 Unsteadiness on feet: Secondary | ICD-10-CM | POA: Diagnosis not present

## 2015-12-27 DIAGNOSIS — E785 Hyperlipidemia, unspecified: Secondary | ICD-10-CM | POA: Diagnosis not present

## 2015-12-27 DIAGNOSIS — I10 Essential (primary) hypertension: Secondary | ICD-10-CM | POA: Diagnosis not present

## 2015-12-27 DIAGNOSIS — E119 Type 2 diabetes mellitus without complications: Secondary | ICD-10-CM | POA: Diagnosis not present

## 2015-12-27 DIAGNOSIS — Z951 Presence of aortocoronary bypass graft: Secondary | ICD-10-CM | POA: Diagnosis not present

## 2015-12-27 DIAGNOSIS — K219 Gastro-esophageal reflux disease without esophagitis: Secondary | ICD-10-CM | POA: Diagnosis not present

## 2015-12-27 DIAGNOSIS — M6281 Muscle weakness (generalized): Secondary | ICD-10-CM | POA: Diagnosis not present

## 2015-12-27 DIAGNOSIS — Z9181 History of falling: Secondary | ICD-10-CM | POA: Diagnosis not present

## 2015-12-28 DIAGNOSIS — Z961 Presence of intraocular lens: Secondary | ICD-10-CM | POA: Diagnosis not present

## 2015-12-28 DIAGNOSIS — H401123 Primary open-angle glaucoma, left eye, severe stage: Secondary | ICD-10-CM | POA: Diagnosis not present

## 2015-12-28 DIAGNOSIS — Z0100 Encounter for examination of eyes and vision without abnormal findings: Secondary | ICD-10-CM | POA: Diagnosis not present

## 2015-12-28 DIAGNOSIS — H401112 Primary open-angle glaucoma, right eye, moderate stage: Secondary | ICD-10-CM | POA: Diagnosis not present

## 2015-12-29 DIAGNOSIS — K219 Gastro-esophageal reflux disease without esophagitis: Secondary | ICD-10-CM | POA: Diagnosis not present

## 2015-12-29 DIAGNOSIS — M6281 Muscle weakness (generalized): Secondary | ICD-10-CM | POA: Diagnosis not present

## 2015-12-29 DIAGNOSIS — I10 Essential (primary) hypertension: Secondary | ICD-10-CM | POA: Diagnosis not present

## 2015-12-29 DIAGNOSIS — R2681 Unsteadiness on feet: Secondary | ICD-10-CM | POA: Diagnosis not present

## 2015-12-29 DIAGNOSIS — E785 Hyperlipidemia, unspecified: Secondary | ICD-10-CM | POA: Diagnosis not present

## 2015-12-29 DIAGNOSIS — E119 Type 2 diabetes mellitus without complications: Secondary | ICD-10-CM | POA: Diagnosis not present

## 2015-12-29 DIAGNOSIS — Z9181 History of falling: Secondary | ICD-10-CM | POA: Diagnosis not present

## 2015-12-29 DIAGNOSIS — Z951 Presence of aortocoronary bypass graft: Secondary | ICD-10-CM | POA: Diagnosis not present

## 2015-12-30 DIAGNOSIS — I25111 Atherosclerotic heart disease of native coronary artery with angina pectoris with documented spasm: Secondary | ICD-10-CM | POA: Diagnosis not present

## 2015-12-30 DIAGNOSIS — I10 Essential (primary) hypertension: Secondary | ICD-10-CM | POA: Diagnosis not present

## 2015-12-30 DIAGNOSIS — E782 Mixed hyperlipidemia: Secondary | ICD-10-CM | POA: Diagnosis not present

## 2016-01-24 DIAGNOSIS — Z23 Encounter for immunization: Secondary | ICD-10-CM | POA: Diagnosis not present

## 2016-03-14 DIAGNOSIS — I255 Ischemic cardiomyopathy: Secondary | ICD-10-CM | POA: Diagnosis not present

## 2016-03-14 DIAGNOSIS — I482 Chronic atrial fibrillation: Secondary | ICD-10-CM | POA: Diagnosis not present

## 2016-03-14 DIAGNOSIS — I5023 Acute on chronic systolic (congestive) heart failure: Secondary | ICD-10-CM | POA: Diagnosis not present

## 2016-03-22 DIAGNOSIS — I255 Ischemic cardiomyopathy: Secondary | ICD-10-CM | POA: Diagnosis not present

## 2016-03-22 DIAGNOSIS — R0602 Shortness of breath: Secondary | ICD-10-CM | POA: Diagnosis not present

## 2016-03-22 DIAGNOSIS — I482 Chronic atrial fibrillation: Secondary | ICD-10-CM | POA: Diagnosis not present

## 2016-03-22 DIAGNOSIS — I5023 Acute on chronic systolic (congestive) heart failure: Secondary | ICD-10-CM | POA: Diagnosis not present

## 2016-03-22 DIAGNOSIS — K5732 Diverticulitis of large intestine without perforation or abscess without bleeding: Secondary | ICD-10-CM | POA: Diagnosis not present

## 2016-04-29 DIAGNOSIS — J301 Allergic rhinitis due to pollen: Secondary | ICD-10-CM | POA: Diagnosis not present

## 2016-04-29 DIAGNOSIS — R042 Hemoptysis: Secondary | ICD-10-CM | POA: Diagnosis not present

## 2016-04-29 DIAGNOSIS — R05 Cough: Secondary | ICD-10-CM | POA: Diagnosis not present

## 2016-05-25 DIAGNOSIS — D225 Melanocytic nevi of trunk: Secondary | ICD-10-CM | POA: Diagnosis not present

## 2016-05-25 DIAGNOSIS — B078 Other viral warts: Secondary | ICD-10-CM | POA: Diagnosis not present

## 2016-05-25 DIAGNOSIS — C44311 Basal cell carcinoma of skin of nose: Secondary | ICD-10-CM | POA: Diagnosis not present

## 2016-07-14 DIAGNOSIS — I10 Essential (primary) hypertension: Secondary | ICD-10-CM | POA: Diagnosis not present

## 2016-07-14 DIAGNOSIS — E1165 Type 2 diabetes mellitus with hyperglycemia: Secondary | ICD-10-CM | POA: Diagnosis not present

## 2016-07-14 DIAGNOSIS — K210 Gastro-esophageal reflux disease with esophagitis: Secondary | ICD-10-CM | POA: Diagnosis not present

## 2016-07-14 DIAGNOSIS — E1142 Type 2 diabetes mellitus with diabetic polyneuropathy: Secondary | ICD-10-CM | POA: Diagnosis not present

## 2016-07-14 DIAGNOSIS — E782 Mixed hyperlipidemia: Secondary | ICD-10-CM | POA: Diagnosis not present

## 2016-07-18 DIAGNOSIS — Z0001 Encounter for general adult medical examination with abnormal findings: Secondary | ICD-10-CM | POA: Diagnosis not present

## 2016-07-18 DIAGNOSIS — K5732 Diverticulitis of large intestine without perforation or abscess without bleeding: Secondary | ICD-10-CM | POA: Diagnosis not present

## 2016-07-18 DIAGNOSIS — I255 Ischemic cardiomyopathy: Secondary | ICD-10-CM | POA: Diagnosis not present

## 2016-07-18 DIAGNOSIS — I482 Chronic atrial fibrillation: Secondary | ICD-10-CM | POA: Diagnosis not present

## 2016-07-18 DIAGNOSIS — I5023 Acute on chronic systolic (congestive) heart failure: Secondary | ICD-10-CM | POA: Diagnosis not present

## 2016-07-27 DIAGNOSIS — I482 Chronic atrial fibrillation: Secondary | ICD-10-CM | POA: Diagnosis not present

## 2016-07-27 DIAGNOSIS — I255 Ischemic cardiomyopathy: Secondary | ICD-10-CM | POA: Diagnosis not present

## 2016-07-27 DIAGNOSIS — K580 Irritable bowel syndrome with diarrhea: Secondary | ICD-10-CM | POA: Diagnosis not present

## 2016-08-03 ENCOUNTER — Encounter: Payer: Self-pay | Admitting: *Deleted

## 2016-08-04 ENCOUNTER — Encounter: Payer: Self-pay | Admitting: *Deleted

## 2016-08-04 ENCOUNTER — Ambulatory Visit (INDEPENDENT_AMBULATORY_CARE_PROVIDER_SITE_OTHER): Payer: Medicare Other | Admitting: Cardiology

## 2016-08-04 VITALS — BP 108/67 | HR 83 | Ht 72.0 in | Wt 244.0 lb

## 2016-08-04 DIAGNOSIS — I2510 Atherosclerotic heart disease of native coronary artery without angina pectoris: Secondary | ICD-10-CM

## 2016-08-04 DIAGNOSIS — I5022 Chronic systolic (congestive) heart failure: Secondary | ICD-10-CM

## 2016-08-04 DIAGNOSIS — I4891 Unspecified atrial fibrillation: Secondary | ICD-10-CM | POA: Diagnosis not present

## 2016-08-04 DIAGNOSIS — I6523 Occlusion and stenosis of bilateral carotid arteries: Secondary | ICD-10-CM | POA: Diagnosis not present

## 2016-08-04 MED ORDER — RIVAROXABAN 15 MG PO TABS: 15.0000 mg | ORAL_TABLET | Freq: Every day | ORAL | 0 refills | Status: DC

## 2016-08-04 NOTE — Progress Notes (Signed)
Clinical Summary Walter [name] is a 83 y.o.male seen today for follow up of the following medical problems.   1. CAD - history of prior CABG - nuclear stress 07/2010 with lateral and anterolateral scar, no active ischemia.   - no recent chest pain.  .    2. Chronic systolic HF - LVEF 36-62% by echo 07/2010 - echo 04/2011 LVEF 45-50%, abnormal diastolic function  - Can have some occaoisnal LE edema. COmpliant with lasix.   3. Hyperlipidemia 08/2015 TC 114 TG 114 HDL 38 LDL 53 - compliant with simvastatin  4. HTN - does not check at home.   5. Afib - no recent palpitations - no bleeding troubles on xarelto  6. Carotid stenosis - mild to moderate disease by last [place] - no recent symptoms.  Past Medical History:  Diagnosis Date  . Anxiety   . Aortic valve sclerosis    No significant stenosis, echo  . Atrial fibrillation (HCC)    Atrial fibrillation/flutter, rate control, Coumadin  . CAD (coronary artery disease)    Nuclear, August, 2005, extensive lateral scar, no ischemia /nuclear, August 09, 2010, large lateral and anterolateral scar with no ischemia, ejection fraction 26%  . Carotid artery disease [facility])    Dr. Scot Dock, Doppler, August, 9476, 54-65% LICA  . CHF (congestive heart failure) (HCC)    Systolic  . Chronic anticoagulation    Pradaxa  . CVA (cerebral infarction)    April, 2011 likely cardioembolic  . Diabetes mellitus    Diagnosis April, 2011  . Dyslipidemia   . Ejection fraction     EF 35-40%, echo, August 10, 2010  /      40-45% (better than before) Echo, January, 2010, ICD not indicated / EF 40%, echo, April, 2011  . GERD (gastroesophageal reflux disease)   . Hx of CABG    1998  . Hypertension   . Ischemic cardiomyopathy    Ischemic  . Leg weakness    Arterial leg Dopplers normal, 2009  . Mitral regurgitation    Mild, echo, January, 2010  . Peripheral neuropathy ([place])   . Positive D-dimer    D-dimer elevated, hospital, April, 2012, chest  CT no pulmonary embolus  . PVC's (premature ventricular contractions)      No Known Allergies   Current Outpatient Prescriptions  Medication Sig Dispense Refill  . ARTIFICIAL TEARS ophthalmic solution Place 1 drop into the left eye 2 (two) times daily.     . brimonidine (ALPHAGAN P) 0.1 % SOLN Place 1 drop into the right eye 2 (two) times daily.      . carvedilol (COREG) 25 MG tablet Take 25 mg by mouth 2 (two) times daily with a meal.      . diazepam (VALIUM) 5 MG tablet Take 2.5 mg by mouth every 12 (twelve) hours as needed.      Marland Kitchen escitalopram (LEXAPRO) 10 MG tablet Take 1 tablet by mouth daily.    . fluticasone (FLONASE) 50 MCG/ACT nasal spray Place 2 sprays into both nostrils 2 (two) times daily.    . furosemide (LASIX) 40 MG tablet Take 40 mg by mouth daily.      Marland Kitchen gabapentin (NEURONTIN) 300 MG capsule Take 300 mg by mouth 2 (two) times daily.      Marland Kitchen latanoprost (XALATAN) 0.005 % ophthalmic solution Place into both eyes at bedtime.    Marland Kitchen losartan (COZAAR) 100 MG tablet Take 100 mg by mouth daily.      Marland Kitchen omeprazole (  PRILOSEC OTC) 20 MG tablet Take 20 mg by mouth daily.      . Rivaroxaban (XARELTO) 15 MG TABS tablet Take 1 tablet (15 mg total) by mouth daily with supper. 126 tablet 0  . simvastatin (ZOCOR) 40 MG tablet Take 20 mg by mouth at bedtime.     . Tamsulosin HCl (FLOMAX) 0.4 MG CAPS Take 0.4 mg by mouth at bedtime.       No current facility-administered medications for this visit.      Past Surgical History:  Procedure Laterality Date  . CORONARY ARTERY BYPASS GRAFT  1998     No Known Allergies    Family History  Problem Relation Age of Onset  . Hypertension    . Diabetes    . CAD       Social History Walter [name] reports that he quit smoking about 34 years ago. His smoking use included Cigarettes. He started smoking about 49 years ago. He has a 22.50 pack-year smoking history. He has never used smokeless tobacco. Walter [name] reports that he does not drink  alcohol.   Review of Systems CONSTITUTIONAL: No weight loss, fever, chills, weakness or fatigue.  HEENT: Eyes: No visual loss, blurred vision, double vision or yellow sclerae.No hearing loss, sneezing, congestion, runny nose or sore throat.  SKIN: No rash or itching.  CARDIOVASCULAR: per hpi RESPIRATORY: No shortness of breath, cough or sputum.  GASTROINTESTINAL: No anorexia, nausea, vomiting or diarrhea. No abdominal pain or blood.  GENITOURINARY: No burning on urination, no polyuria NEUROLOGICAL: No headache, dizziness, syncope, paralysis, ataxia, numbness or tingling in the extremities. No change in bowel or bladder control.  MUSCULOSKELETAL: No muscle, back pain, joint pain or stiffness.  LYMPHATICS: No enlarged nodes. No history of splenectomy.  PSYCHIATRIC: No history of depression or anxiety.  ENDOCRINOLOGIC: No reports of sweating, cold or heat intolerance. No polyuria or polydipsia.  Marland Kitchen   Physical Examination Vitals:   08/04/16 1548  BP: 108/67  Pulse: 83   Filed Weights   08/04/16 1548  Weight: 244 lb (110.7 kg)    Gen: resting comfortably, no acute distress HEENT: no scleral icterus, pupils equal round and reactive, no palptable cervical adenopathy,  CV: RRR, no m/r/g no jvd Resp: Clear to auscultation bilaterally GI: abdomen is soft, non-tender, non-distended, normal bowel sounds, no hepatosplenomegaly MSK: extremities are warm, no edema.  Skin: warm, no rash Neuro:  no focal deficits Psych: appropriate affect     Assessment and Plan   1. CAD - no recent symptoms, he will continue current meds  2. Chronic systolic HF - mildly decreased to low normal LVEF by last echo - some recent LE edema. Counseled ok to take additional lasix as needed for swelling.   3. Carotid stenosis - we will repeat carotid US  4. Hyperlipidemia - continue staitn, he is at goal.   5. HTN - bp is at goal, continue current meds  6. Afib - no symptoms - continue  current meds. CHADS2Vasc score is 5, continue anticoagulation   F/u 1 year     Arnoldo Lenis, M.D.

## 2016-08-04 NOTE — Patient Instructions (Signed)
Your physician wants you to follow-up in: [place] will receive a reminder letter in the mail two months in advance. If you don't receive a letter, please call our office to schedule the follow-up appointment.  Your physician has recommended you make the following change in your medication:   CHANGE LASIX 60 MG IN THE MORNING AND 40 MG IN THE EVENING FOR THE NEXT 3 DAYS - THEN RESUME LASIX 50 MG TWICE DAILY  Your physician has requested that you have a carotid duplex. This test is an ultrasound of the carotid arteries in your neck. It looks at blood flow through these arteries that supply the brain with blood. Allow one hour for this exam. There are no restrictions or special instructions.  Thank you for choosing [facility]!!

## 2016-09-05 DIAGNOSIS — I255 Ischemic cardiomyopathy: Secondary | ICD-10-CM | POA: Diagnosis not present

## 2016-09-05 DIAGNOSIS — K580 Irritable bowel syndrome with diarrhea: Secondary | ICD-10-CM | POA: Diagnosis not present

## 2016-09-05 DIAGNOSIS — R1084 Generalized abdominal pain: Secondary | ICD-10-CM | POA: Diagnosis not present

## 2016-09-05 DIAGNOSIS — I482 Chronic atrial fibrillation: Secondary | ICD-10-CM | POA: Diagnosis not present

## 2016-09-08 DIAGNOSIS — J90 Pleural effusion, not elsewhere classified: Secondary | ICD-10-CM | POA: Diagnosis not present

## 2016-09-08 DIAGNOSIS — I700 Atherosclerosis of aorta: Secondary | ICD-10-CM | POA: Diagnosis not present

## 2016-09-08 DIAGNOSIS — K4090 Unilateral inguinal hernia, without obstruction or gangrene, not specified as recurrent: Secondary | ICD-10-CM | POA: Diagnosis not present

## 2016-09-08 DIAGNOSIS — K580 Irritable bowel syndrome with diarrhea: Secondary | ICD-10-CM | POA: Diagnosis not present

## 2016-09-08 DIAGNOSIS — N200 Calculus of kidney: Secondary | ICD-10-CM | POA: Diagnosis not present

## 2016-09-08 DIAGNOSIS — N400 Enlarged prostate without lower urinary tract symptoms: Secondary | ICD-10-CM | POA: Diagnosis not present

## 2016-09-08 DIAGNOSIS — I517 Cardiomegaly: Secondary | ICD-10-CM | POA: Diagnosis not present

## 2016-10-02 ENCOUNTER — Ambulatory Visit: Payer: Medicare Other | Admitting: Cardiology

## 2016-10-11 ENCOUNTER — Ambulatory Visit: Payer: Medicare Other

## 2016-10-11 DIAGNOSIS — I6523 Occlusion and stenosis of bilateral carotid arteries: Secondary | ICD-10-CM

## 2016-10-11 LAB — VAS US CAROTID
LCCADDIAS: -12 cm/s
LCCADSYS: -37 cm/s
LCCAPDIAS: 14 cm/s
LCCAPSYS: 68 cm/s
LEFT ECA DIAS: -23 cm/s
LEFT VERTEBRAL DIAS: -6 cm/s
LICADDIAS: -20 cm/s
LICADSYS: -102 cm/s
LICAPSYS: -131 cm/s
Left ICA prox dias: -37 cm/s
RCCAPSYS: 83 cm/s
RIGHT ECA DIAS: -15 cm/s
RIGHT VERTEBRAL DIAS: -14 cm/s
Right CCA prox dias: 15 cm/s
Right cca dist sys: -73 cm/s

## 2016-10-16 ENCOUNTER — Telehealth: Payer: Self-pay

## 2016-10-16 NOTE — Telephone Encounter (Signed)
Patient and wife notified. Routed to PCP 

## 2016-10-16 NOTE — Telephone Encounter (Signed)
-----   Message from Arnoldo Lenis, MD sent at 10/16/2016  1:00 PM EDT ----- Carotid US shows only very mild plaque, no significant blockages. Overall looks good  J BrancH MD

## 2016-10-29 DIAGNOSIS — M1990 Unspecified osteoarthritis, unspecified site: Secondary | ICD-10-CM | POA: Diagnosis not present

## 2016-10-29 DIAGNOSIS — Z8673 Personal history of transient ischemic attack (TIA), and cerebral infarction without residual deficits: Secondary | ICD-10-CM | POA: Diagnosis not present

## 2016-10-29 DIAGNOSIS — K219 Gastro-esophageal reflux disease without esophagitis: Secondary | ICD-10-CM | POA: Diagnosis not present

## 2016-10-29 DIAGNOSIS — E7800 Pure hypercholesterolemia, unspecified: Secondary | ICD-10-CM | POA: Diagnosis not present

## 2016-10-29 DIAGNOSIS — R319 Hematuria, unspecified: Secondary | ICD-10-CM | POA: Diagnosis not present

## 2016-10-29 DIAGNOSIS — E119 Type 2 diabetes mellitus without complications: Secondary | ICD-10-CM | POA: Diagnosis not present

## 2016-10-29 DIAGNOSIS — I509 Heart failure, unspecified: Secondary | ICD-10-CM | POA: Diagnosis not present

## 2016-10-29 DIAGNOSIS — Z7901 Long term (current) use of anticoagulants: Secondary | ICD-10-CM | POA: Diagnosis not present

## 2016-10-29 DIAGNOSIS — Z79899 Other long term (current) drug therapy: Secondary | ICD-10-CM | POA: Diagnosis not present

## 2016-10-29 DIAGNOSIS — R339 Retention of urine, unspecified: Secondary | ICD-10-CM | POA: Diagnosis not present

## 2016-10-29 DIAGNOSIS — I110 Hypertensive heart disease with heart failure: Secondary | ICD-10-CM | POA: Diagnosis not present

## 2016-10-30 DIAGNOSIS — R310 Gross hematuria: Secondary | ICD-10-CM | POA: Diagnosis not present

## 2016-11-06 DIAGNOSIS — R310 Gross hematuria: Secondary | ICD-10-CM | POA: Diagnosis not present

## 2016-11-06 DIAGNOSIS — N200 Calculus of kidney: Secondary | ICD-10-CM | POA: Diagnosis not present

## 2016-11-13 DIAGNOSIS — R310 Gross hematuria: Secondary | ICD-10-CM | POA: Diagnosis not present

## 2016-11-20 DIAGNOSIS — Z961 Presence of intraocular lens: Secondary | ICD-10-CM | POA: Diagnosis not present

## 2016-11-20 DIAGNOSIS — H401423 Capsular glaucoma with pseudoexfoliation of lens, left eye, severe stage: Secondary | ICD-10-CM | POA: Diagnosis not present

## 2016-11-20 DIAGNOSIS — H401411 Capsular glaucoma with pseudoexfoliation of lens, right eye, mild stage: Secondary | ICD-10-CM | POA: Diagnosis not present

## 2016-11-23 DIAGNOSIS — N3001 Acute cystitis with hematuria: Secondary | ICD-10-CM | POA: Diagnosis not present

## 2016-11-23 DIAGNOSIS — R296 Repeated falls: Secondary | ICD-10-CM | POA: Diagnosis not present

## 2016-11-28 ENCOUNTER — Other Ambulatory Visit: Payer: Self-pay

## 2016-11-28 NOTE — Patient Outreach (Addendum)
[facility]) Care Management  11/28/2016  SACRAMENTO MONDS [birth year] [id number]   Telephone Screen  Referral Date: 11/28/16 Referral Source: MD office([name]) Referral Reason: " HTN, high fall risk, confusion" Insurance: [facility] Medicare   Outreach attempt #1 to patient. Spoke with spouse due to patient's documented history of confusion at times.    Social: Spouse reports that patient resides in her the home with her along with their 56 yr old son and 47 yr old grandson. Their son works during the day and limited ability to assist. Currently the grandson is not working and helping her provide care to patient. Spouse states that patient can't do anything on his own and requires assistance with all ADLs/IADLs. Spouse report that patient has had multiple falls in the home. Within the last month he has had three falls. She states that patient "can't walk a lick without help." She attributes his leg weakness and difficulty walking to patient having really bad peripheral neuropathy. DME in the home include walker, cane and lift chair. Patient's son taking him back and forth to MD appts. Spouse voices some caregiver burnout/fatigue related to providing care for patient 4/7 despite having multiple medical issues of her own to deal with.  Conditions: Per records patient has PMH of CAD, CABG, CHF, HLD, HTN, A-fib, carotid stenosis, ischemic cardiomyopathy, peripheral neuropathy and stroke. Spouse states that patient's health has been "failing for the past five years and has gotten worse in the last 6 months." She reports that patient was in the ER recently for hematuria and referred to urologist. He saw urologist(Dr. Tammi Klippel) who reported patient had cyst on kidney and enlarged prostate. Patient has been taking antibiotic for possible UTI. Spouse also voices that patient is blind in his left eye. He has occasional issues with fluid retention and edema. Current weight 234 lbs.Spouse is able  to weigh patient some days when is steady enough. Spouse voices that patient is borderline diabetic as well. He had been having issues with diarrhea for past several days but she gave him some OTC meds and symptoms have improved.    Medications: Per spouse taking eight meds. She is managing his meds and administering them to him. She voices no issues with affording and obtaining meds as they get them through [employer] order and have no co pays. She reprints she gets samples of Xarelto from MD office.   Appointments: Patient saw PCP on 11/23/16.   Advance Directives: Spouse reports that she knows that they both need to complete paperwork. She is interested in getting paperwork done before patient's condition worsen. Spouse voices that patient is still mentally competent to complete paperwork.    Consent: [facility] services reviewed and discussed. Spouse gave verbal consent for [facility] services.   Plan: RN CM will notify [facility] administrative assistant of case status. RN CM  will send referral to [facility] RN for further in home eval/assessment of care needs and management of chronic conditions. RN CM will send [facility] SW referral for possible assistance with completing advance directives and possible community resources related to in home support to prevent caregiver burnout.   Enzo Montgomery, RN,BSN,CCM [place] Management Telephonic Care Management Coordinator Direct Phone: [phone] Toll Free: [phone] Fax: [phone]

## 2016-11-29 DIAGNOSIS — I2510 Atherosclerotic heart disease of native coronary artery without angina pectoris: Secondary | ICD-10-CM | POA: Diagnosis not present

## 2016-11-29 DIAGNOSIS — N200 Calculus of kidney: Secondary | ICD-10-CM | POA: Diagnosis not present

## 2016-11-29 DIAGNOSIS — D689 Coagulation defect, unspecified: Secondary | ICD-10-CM | POA: Diagnosis not present

## 2016-11-29 DIAGNOSIS — K649 Unspecified hemorrhoids: Secondary | ICD-10-CM | POA: Diagnosis not present

## 2016-11-29 DIAGNOSIS — H401190 Primary open-angle glaucoma, unspecified eye, stage unspecified: Secondary | ICD-10-CM | POA: Diagnosis not present

## 2016-11-29 DIAGNOSIS — Z79899 Other long term (current) drug therapy: Secondary | ICD-10-CM | POA: Diagnosis not present

## 2016-11-29 DIAGNOSIS — R319 Hematuria, unspecified: Secondary | ICD-10-CM | POA: Diagnosis not present

## 2016-11-29 DIAGNOSIS — I482 Chronic atrial fibrillation: Secondary | ICD-10-CM | POA: Diagnosis not present

## 2016-11-29 DIAGNOSIS — K625 Hemorrhage of anus and rectum: Secondary | ICD-10-CM | POA: Diagnosis not present

## 2016-11-29 DIAGNOSIS — Z7982 Long term (current) use of aspirin: Secondary | ICD-10-CM | POA: Diagnosis not present

## 2016-11-29 DIAGNOSIS — R350 Frequency of micturition: Secondary | ICD-10-CM | POA: Diagnosis not present

## 2016-11-29 DIAGNOSIS — G309 Alzheimer's disease, unspecified: Secondary | ICD-10-CM | POA: Diagnosis not present

## 2016-11-29 DIAGNOSIS — K5909 Other constipation: Secondary | ICD-10-CM | POA: Diagnosis not present

## 2016-11-29 DIAGNOSIS — R748 Abnormal levels of other serum enzymes: Secondary | ICD-10-CM | POA: Diagnosis not present

## 2016-11-29 DIAGNOSIS — R262 Difficulty in walking, not elsewhere classified: Secondary | ICD-10-CM | POA: Diagnosis not present

## 2016-11-29 DIAGNOSIS — E785 Hyperlipidemia, unspecified: Secondary | ICD-10-CM | POA: Diagnosis not present

## 2016-11-29 DIAGNOSIS — T45515A Adverse effect of anticoagulants, initial encounter: Secondary | ICD-10-CM | POA: Diagnosis not present

## 2016-11-29 DIAGNOSIS — N401 Enlarged prostate with lower urinary tract symptoms: Secondary | ICD-10-CM | POA: Diagnosis not present

## 2016-11-29 DIAGNOSIS — Z7901 Long term (current) use of anticoagulants: Secondary | ICD-10-CM | POA: Diagnosis not present

## 2016-11-29 DIAGNOSIS — E1142 Type 2 diabetes mellitus with diabetic polyneuropathy: Secondary | ICD-10-CM | POA: Diagnosis not present

## 2016-11-29 DIAGNOSIS — Z951 Presence of aortocoronary bypass graft: Secondary | ICD-10-CM | POA: Diagnosis not present

## 2016-11-29 DIAGNOSIS — Z9181 History of falling: Secondary | ICD-10-CM | POA: Diagnosis not present

## 2016-11-29 DIAGNOSIS — D6832 Hemorrhagic disorder due to extrinsic circulating anticoagulants: Secondary | ICD-10-CM | POA: Diagnosis not present

## 2016-11-30 ENCOUNTER — Other Ambulatory Visit: Payer: Self-pay

## 2016-11-30 ENCOUNTER — Other Ambulatory Visit: Payer: Self-pay | Admitting: *Deleted

## 2016-11-30 NOTE — Patient Outreach (Signed)
[facility] Care Management  11/30/2016  Walter [name] 1931/05/11 [id number]   Care Coordination   Incoming call from patient's spouse. She was very anxious and wanted to know when [facility] nurse would be coming out to make home visit with patient. Advised spouse that as discussed previously they have up to 10 business days to make outreach call. Spouse states that patient was in the hospital for observational overnight stay since last speaking with RN CM and wanted to make nurse aware. Advised spouse that RN CM would notify assigned [facility] community nurse of this phone call and request f/u.   Plan: RN CM will send in-basket message to assigned [facility] RN and request that RN f/u with spouse/patient.   Enzo Montgomery, RN,BSN,CCM [place] Management Telephonic Care Management Coordinator Direct Phone: [phone] Toll Free: [phone] Fax: [phone]

## 2016-11-30 NOTE — Patient Outreach (Signed)
Telephone call to schedule initial home visit, spoke with patient's wife, HIPAA verified and home visit scheduled for tomorrow.  [name] [facility], [place] Coordinator [phone]

## 2016-12-01 ENCOUNTER — Encounter: Payer: Self-pay | Admitting: *Deleted

## 2016-12-01 ENCOUNTER — Other Ambulatory Visit: Payer: Self-pay | Admitting: Licensed Clinical Social Worker

## 2016-12-01 ENCOUNTER — Other Ambulatory Visit: Payer: Self-pay | Admitting: *Deleted

## 2016-12-01 ENCOUNTER — Encounter: Payer: Self-pay | Admitting: Licensed Clinical Social Worker

## 2016-12-01 DIAGNOSIS — E785 Hyperlipidemia, unspecified: Secondary | ICD-10-CM | POA: Diagnosis not present

## 2016-12-01 DIAGNOSIS — I5032 Chronic diastolic (congestive) heart failure: Secondary | ICD-10-CM | POA: Diagnosis not present

## 2016-12-01 DIAGNOSIS — K219 Gastro-esophageal reflux disease without esophagitis: Secondary | ICD-10-CM | POA: Diagnosis not present

## 2016-12-01 DIAGNOSIS — E1142 Type 2 diabetes mellitus with diabetic polyneuropathy: Secondary | ICD-10-CM | POA: Diagnosis not present

## 2016-12-01 DIAGNOSIS — Z7901 Long term (current) use of anticoagulants: Secondary | ICD-10-CM | POA: Diagnosis not present

## 2016-12-01 DIAGNOSIS — K625 Hemorrhage of anus and rectum: Secondary | ICD-10-CM | POA: Diagnosis not present

## 2016-12-01 DIAGNOSIS — N400 Enlarged prostate without lower urinary tract symptoms: Secondary | ICD-10-CM | POA: Diagnosis not present

## 2016-12-01 DIAGNOSIS — I482 Chronic atrial fibrillation: Secondary | ICD-10-CM | POA: Diagnosis not present

## 2016-12-01 DIAGNOSIS — R339 Retention of urine, unspecified: Secondary | ICD-10-CM | POA: Diagnosis not present

## 2016-12-01 NOTE — Patient Outreach (Signed)
Assessment:  CSW received referral on Walter [name]. CSW completed chart review on client on 12/01/16. Client see [name] as primary care doctor.  Client has support from his spouse, Walter [name]. Walter [name]. CSW called client on 12/01/16 and spoke via phone with client. CSW verified client identity. CSW received verbal permission from client on 12/01/16 for CSW to communicate with client and spouse of client, Walter [name], regarding client needs and status.  CSW spoke with Walter [name] about client needs. Walter [name] and CSW spoke of client request for Advanced Directives information.  CSW spoke with Walter [name] about Advanced Directives information booklet to be mailed to client. CSW informed Walter [name] on 12/01/16 that Case Management Assistant, Walter [name], would mail client Advanced Directives booklet for client to review.  CSW and Walter [name] completed [facility] assessments for client. Walter [name] said client had fallen 3 times in the past month. Client has a walker to use for ambulation assistance. Walter [name] is also providing [facility] nursing support for client.  Walter [name] said client had been bleeding recently when urinating and when voiding.  She said doctor stopped client dosage of xarelto. Walter [name] said client bleeding is now much improved since xarelto had been stopped..  She said client has to be reminded to use walker or cane when walking. Client does have lift chair in the home to use as  Needed.  Walter [name] and CSW spoke of client care plan. CSW encouraged that client or Walter [name] communicate with CSW in next 30 days to discuss community resources of assistance for client. CSW provided Walter [name] with CSW phone number of [phone]. CSW thanked Walter [name] for phone call with CSW on 12/01/16. CSW encouraged that client or Walter [name] call CSW at [phone] as needed to discus social work needs of client.     Plan:  Client or Walter [name] to communicate with CSW in next 30 days to discuss community resources of assistance for client.  CSW to collaborate with RN  Walter [name] in monitoring needs of client.  CSW to call client or Walter [name] in 2 weeks to assess client needs at that time.  Walter [name].Walter [name] MSW, LCSW Licensed Clinical Social Worker [facility] Care Management [phone]

## 2016-12-01 NOTE — Patient Outreach (Addendum)
[facility]) Care Management   12/01/2016  Joahan Swatzell Heumann Sep 20, 1931 [id number]  Alexzavier Girardin Goncalves is an 83 y.o. male  Subjective: Initial home visit with pt, wife and children present, HIPAA verified, wife states her son and grandson lives with them and assists with pt.  Pt has not had a fall in over a month but has had several in past year.  Wife states pt not always able to weigh due to being off balance.  Wife states [place] to start seeing pt today.  Pt started cipro few days ago although urinalysis clear wife states " dr thinks may be something to do with prostate"    Objective:   Vitals:   12/01/16 1252  BP: 102/60  Pulse: 60  Resp: 18  SpO2: 98%  Weight: 235 lb (106.6 kg)  Height: 1.829 m (6')   ROS  Physical Exam  Constitutional: He is oriented to person, place, and time. He appears well-developed and well-nourished.  HENT:  Head: Normocephalic.  Neck: Normal range of motion. Neck supple.  Cardiovascular: Normal rate.   Irregular rhythm  Respiratory: Effort normal and breath sounds normal.  GI: Soft. Bowel sounds are normal.  Musculoskeletal: Normal range of motion. He exhibits edema.  2+ edema lower extremities bil  Neurological: He is alert and oriented to person, place, and time.  Confused at times  Skin: Skin is warm and dry.  Psychiatric: He has a normal mood and affect. His behavior is normal. Thought content normal.    Encounter Medications:   Outpatient Encounter Prescriptions as of 12/01/2016  Medication Sig Note  . ARTIFICIAL TEARS ophthalmic solution Place 1 drop into the left eye 2 (two) times daily.    Marland Kitchen aspirin 325 MG EC tablet Take 325 mg by mouth daily.   . brimonidine (ALPHAGAN P) 0.1 % SOLN Place 1 drop into the right eye 2 (two) times daily.     . carvedilol (COREG) 25 MG tablet Take 12.5 mg by mouth 2 (two) times daily with a meal.    . diazepam (VALIUM) 5 MG tablet Take 2.5 mg by mouth every 12 (twelve) hours as needed.      Marland Kitchen escitalopram (LEXAPRO) 10 MG tablet Take 1 tablet by mouth daily. 10/14/2015: Received from: External Pharmacy Received Sig:   . fluticasone (FLONASE) 50 MCG/ACT nasal spray Place 2 sprays into both nostrils 2 (two) times daily. 10/14/2015: Received from: External Pharmacy Received Sig:   . furosemide (LASIX) 40 MG tablet Take 80 mg by mouth 2 (two) times daily.    Marland Kitchen gabapentin (NEURONTIN) 300 MG capsule Take 300 mg by mouth 2 (two) times daily. Take one in morning and two in evening   . latanoprost (XALATAN) 0.005 % ophthalmic solution Place into both eyes at bedtime.   Marland Kitchen omeprazole (PRILOSEC OTC) 20 MG tablet Take 20 mg by mouth daily.     . simvastatin (ZOCOR) 40 MG tablet Take 20 mg by mouth at bedtime.    . Tamsulosin HCl (FLOMAX) 0.4 MG CAPS Take 0.4 mg by mouth at bedtime.     . [DISCONTINUED] losartan (COZAAR) 100 MG tablet Take 100 mg by mouth daily.     . [DISCONTINUED] Rivaroxaban (XARELTO) 15 MG TABS tablet Take 1 tablet (15 mg total) by mouth daily with supper.    No facility-administered encounter medications on file as of 12/01/2016.     Functional Status:   In your present state of health, do you have any difficulty performing  the following activities: 12/01/2016  Hearing? Y  Vision? Y  Difficulty concentrating or making decisions? Y  Walking or climbing stairs? Y  Dressing or bathing? Y  Doing errands, shopping? Y  Preparing Food and eating ? Y  Using the Toilet? N  In the past six months, have you accidently leaked urine? N  Do you have problems with loss of bowel control? N  Managing your Medications? Y  Managing your Finances? Y  Housekeeping or managing your Housekeeping? Y  Some recent data might be hidden    Fall/Depression Screening:    Fall Risk  12/01/2016 11/28/2016  Falls in the past year? Yes Yes  Number falls in past yr: 2 or more 2 or more  Injury with Fall? No Yes  Risk Factor Category  High Fall Risk High Fall Risk  Risk for fall due to : History  of fall(s);Impaired balance/gait;Impaired mobility History of fall(s);Impaired balance/gait;Impaired mobility;Mental status change;Medication side effect;Impaired vision  Follow up Falls prevention discussed Falls evaluation completed;Falls prevention discussed   PHQ 2/9 Scores 12/01/2016 11/28/2016  PHQ - 2 Score 2 -  PHQ- 9 Score 8 -  Exception Documentation - Other- indicate reason in comment box  Not completed - call completed with spouse    Assessment:  Pt has good support from family, wants to increase endurance and walk more and hopes PT will help reach this goal.  RN CM reviewed all medications with pt and family, wife verbalizes good understanding of medications.  RN CM gave pt, family 1 hour nurse line, [place] calendar and [facility] handouts, CHF booklet.  RN CM faxed initial home visit and barrier letter to primary MD [name].  THN CM Care Plan Problem One     Most Recent Value  Care Plan Problem One  Pt high risk for falls  Role Documenting the Problem One  Care Management [place] for Problem One  Active  THN Long Term Goal   Pt will have no falls within 60 days  THN Long Term Goal Start Date  12/01/16  Interventions for Problem One Long Term Goal  RN CM talked with home health nurse Mervin Hack during joint home visit and verified pt will have RN, PT, OT services,  Rn CM reviewed safety precautions and importance of using walker at all times and walking with assistance at all times.  THN CM Short Term Goal #1   Pt / family will demonstrate safety precautions (using walker, assisting pt, etc) within 30 days  THN CM Short Term Goal #1 Start Date  12/01/16  Interventions for Short Term Goal #1  RN CM reviewed importance of working with PT for strengthening and increased endurance, not ambulating if dizzy.    THN CM Care Plan Problem Two     Most Recent Value  Care Plan Problem Two  Knowledge deficit related to CHF  Role Documenting the Problem Two  Care Management  Coordinator  Care Plan for Problem Two  Active  THN CM Short Term Goal #1   Pt will verbalize CHF zones within 30 days.  THN CM Short Term Goal #1 Start Date  12/01/16  Interventions for Short Term Goal #2   RN CM gave pt copy of CHF zones/ action plan and reviewed with pt and family, reviewed importance of calling early for change in health status, symptoms      Plan: see pt for home visit next month Collaborate with home health as needed  [name] RNC,  BSN [place] Coordinator [phone]

## 2016-12-04 DIAGNOSIS — K625 Hemorrhage of anus and rectum: Secondary | ICD-10-CM | POA: Diagnosis not present

## 2016-12-04 DIAGNOSIS — R339 Retention of urine, unspecified: Secondary | ICD-10-CM | POA: Diagnosis not present

## 2016-12-04 DIAGNOSIS — E785 Hyperlipidemia, unspecified: Secondary | ICD-10-CM | POA: Diagnosis not present

## 2016-12-04 DIAGNOSIS — K219 Gastro-esophageal reflux disease without esophagitis: Secondary | ICD-10-CM | POA: Diagnosis not present

## 2016-12-04 DIAGNOSIS — E1142 Type 2 diabetes mellitus with diabetic polyneuropathy: Secondary | ICD-10-CM | POA: Diagnosis not present

## 2016-12-04 DIAGNOSIS — N400 Enlarged prostate without lower urinary tract symptoms: Secondary | ICD-10-CM | POA: Diagnosis not present

## 2016-12-04 DIAGNOSIS — I482 Chronic atrial fibrillation: Secondary | ICD-10-CM | POA: Diagnosis not present

## 2016-12-04 DIAGNOSIS — Z7901 Long term (current) use of anticoagulants: Secondary | ICD-10-CM | POA: Diagnosis not present

## 2016-12-04 DIAGNOSIS — I5032 Chronic diastolic (congestive) heart failure: Secondary | ICD-10-CM | POA: Diagnosis not present

## 2016-12-05 DIAGNOSIS — K219 Gastro-esophageal reflux disease without esophagitis: Secondary | ICD-10-CM | POA: Diagnosis not present

## 2016-12-05 DIAGNOSIS — K625 Hemorrhage of anus and rectum: Secondary | ICD-10-CM | POA: Diagnosis not present

## 2016-12-05 DIAGNOSIS — N400 Enlarged prostate without lower urinary tract symptoms: Secondary | ICD-10-CM | POA: Diagnosis not present

## 2016-12-05 DIAGNOSIS — I482 Chronic atrial fibrillation: Secondary | ICD-10-CM | POA: Diagnosis not present

## 2016-12-05 DIAGNOSIS — E785 Hyperlipidemia, unspecified: Secondary | ICD-10-CM | POA: Diagnosis not present

## 2016-12-05 DIAGNOSIS — Z7901 Long term (current) use of anticoagulants: Secondary | ICD-10-CM | POA: Diagnosis not present

## 2016-12-05 DIAGNOSIS — R339 Retention of urine, unspecified: Secondary | ICD-10-CM | POA: Diagnosis not present

## 2016-12-05 DIAGNOSIS — I5032 Chronic diastolic (congestive) heart failure: Secondary | ICD-10-CM | POA: Diagnosis not present

## 2016-12-05 DIAGNOSIS — E1142 Type 2 diabetes mellitus with diabetic polyneuropathy: Secondary | ICD-10-CM | POA: Diagnosis not present

## 2016-12-06 DIAGNOSIS — K219 Gastro-esophageal reflux disease without esophagitis: Secondary | ICD-10-CM | POA: Diagnosis not present

## 2016-12-06 DIAGNOSIS — N400 Enlarged prostate without lower urinary tract symptoms: Secondary | ICD-10-CM | POA: Diagnosis not present

## 2016-12-06 DIAGNOSIS — E1142 Type 2 diabetes mellitus with diabetic polyneuropathy: Secondary | ICD-10-CM | POA: Diagnosis not present

## 2016-12-06 DIAGNOSIS — R339 Retention of urine, unspecified: Secondary | ICD-10-CM | POA: Diagnosis not present

## 2016-12-06 DIAGNOSIS — Z7901 Long term (current) use of anticoagulants: Secondary | ICD-10-CM | POA: Diagnosis not present

## 2016-12-06 DIAGNOSIS — K625 Hemorrhage of anus and rectum: Secondary | ICD-10-CM | POA: Diagnosis not present

## 2016-12-06 DIAGNOSIS — E785 Hyperlipidemia, unspecified: Secondary | ICD-10-CM | POA: Diagnosis not present

## 2016-12-06 DIAGNOSIS — I482 Chronic atrial fibrillation: Secondary | ICD-10-CM | POA: Diagnosis not present

## 2016-12-06 DIAGNOSIS — I5032 Chronic diastolic (congestive) heart failure: Secondary | ICD-10-CM | POA: Diagnosis not present

## 2016-12-07 DIAGNOSIS — K219 Gastro-esophageal reflux disease without esophagitis: Secondary | ICD-10-CM | POA: Diagnosis not present

## 2016-12-07 DIAGNOSIS — E1142 Type 2 diabetes mellitus with diabetic polyneuropathy: Secondary | ICD-10-CM | POA: Diagnosis not present

## 2016-12-07 DIAGNOSIS — R339 Retention of urine, unspecified: Secondary | ICD-10-CM | POA: Diagnosis not present

## 2016-12-07 DIAGNOSIS — N400 Enlarged prostate without lower urinary tract symptoms: Secondary | ICD-10-CM | POA: Diagnosis not present

## 2016-12-07 DIAGNOSIS — Z7901 Long term (current) use of anticoagulants: Secondary | ICD-10-CM | POA: Diagnosis not present

## 2016-12-07 DIAGNOSIS — K625 Hemorrhage of anus and rectum: Secondary | ICD-10-CM | POA: Diagnosis not present

## 2016-12-07 DIAGNOSIS — I5032 Chronic diastolic (congestive) heart failure: Secondary | ICD-10-CM | POA: Diagnosis not present

## 2016-12-07 DIAGNOSIS — I482 Chronic atrial fibrillation: Secondary | ICD-10-CM | POA: Diagnosis not present

## 2016-12-07 DIAGNOSIS — E785 Hyperlipidemia, unspecified: Secondary | ICD-10-CM | POA: Diagnosis not present

## 2016-12-12 DIAGNOSIS — R339 Retention of urine, unspecified: Secondary | ICD-10-CM | POA: Diagnosis not present

## 2016-12-12 DIAGNOSIS — K219 Gastro-esophageal reflux disease without esophagitis: Secondary | ICD-10-CM | POA: Diagnosis not present

## 2016-12-12 DIAGNOSIS — E1142 Type 2 diabetes mellitus with diabetic polyneuropathy: Secondary | ICD-10-CM | POA: Diagnosis not present

## 2016-12-12 DIAGNOSIS — Z7901 Long term (current) use of anticoagulants: Secondary | ICD-10-CM | POA: Diagnosis not present

## 2016-12-12 DIAGNOSIS — I482 Chronic atrial fibrillation: Secondary | ICD-10-CM | POA: Diagnosis not present

## 2016-12-12 DIAGNOSIS — N400 Enlarged prostate without lower urinary tract symptoms: Secondary | ICD-10-CM | POA: Diagnosis not present

## 2016-12-12 DIAGNOSIS — K625 Hemorrhage of anus and rectum: Secondary | ICD-10-CM | POA: Diagnosis not present

## 2016-12-12 DIAGNOSIS — I5032 Chronic diastolic (congestive) heart failure: Secondary | ICD-10-CM | POA: Diagnosis not present

## 2016-12-12 DIAGNOSIS — E785 Hyperlipidemia, unspecified: Secondary | ICD-10-CM | POA: Diagnosis not present

## 2016-12-13 DIAGNOSIS — I5032 Chronic diastolic (congestive) heart failure: Secondary | ICD-10-CM | POA: Diagnosis not present

## 2016-12-13 DIAGNOSIS — I482 Chronic atrial fibrillation: Secondary | ICD-10-CM | POA: Diagnosis not present

## 2016-12-13 DIAGNOSIS — E785 Hyperlipidemia, unspecified: Secondary | ICD-10-CM | POA: Diagnosis not present

## 2016-12-13 DIAGNOSIS — K219 Gastro-esophageal reflux disease without esophagitis: Secondary | ICD-10-CM | POA: Diagnosis not present

## 2016-12-13 DIAGNOSIS — Z7901 Long term (current) use of anticoagulants: Secondary | ICD-10-CM | POA: Diagnosis not present

## 2016-12-13 DIAGNOSIS — E1142 Type 2 diabetes mellitus with diabetic polyneuropathy: Secondary | ICD-10-CM | POA: Diagnosis not present

## 2016-12-13 DIAGNOSIS — N400 Enlarged prostate without lower urinary tract symptoms: Secondary | ICD-10-CM | POA: Diagnosis not present

## 2016-12-13 DIAGNOSIS — R339 Retention of urine, unspecified: Secondary | ICD-10-CM | POA: Diagnosis not present

## 2016-12-13 DIAGNOSIS — K625 Hemorrhage of anus and rectum: Secondary | ICD-10-CM | POA: Diagnosis not present

## 2016-12-14 ENCOUNTER — Other Ambulatory Visit: Payer: Self-pay | Admitting: Licensed Clinical Social Worker

## 2016-12-14 DIAGNOSIS — I482 Chronic atrial fibrillation: Secondary | ICD-10-CM | POA: Diagnosis not present

## 2016-12-14 DIAGNOSIS — E1142 Type 2 diabetes mellitus with diabetic polyneuropathy: Secondary | ICD-10-CM | POA: Diagnosis not present

## 2016-12-14 DIAGNOSIS — K625 Hemorrhage of anus and rectum: Secondary | ICD-10-CM | POA: Diagnosis not present

## 2016-12-14 DIAGNOSIS — E785 Hyperlipidemia, unspecified: Secondary | ICD-10-CM | POA: Diagnosis not present

## 2016-12-14 DIAGNOSIS — R339 Retention of urine, unspecified: Secondary | ICD-10-CM | POA: Diagnosis not present

## 2016-12-14 DIAGNOSIS — Z7901 Long term (current) use of anticoagulants: Secondary | ICD-10-CM | POA: Diagnosis not present

## 2016-12-14 DIAGNOSIS — K219 Gastro-esophageal reflux disease without esophagitis: Secondary | ICD-10-CM | POA: Diagnosis not present

## 2016-12-14 DIAGNOSIS — N400 Enlarged prostate without lower urinary tract symptoms: Secondary | ICD-10-CM | POA: Diagnosis not present

## 2016-12-14 DIAGNOSIS — I5032 Chronic diastolic (congestive) heart failure: Secondary | ICD-10-CM | POA: Diagnosis not present

## 2016-12-14 NOTE — Patient Outreach (Signed)
Assessment:  CSW spoke via phone with client. CSW verified client identity. CSW received verbal permission from client on 12/14/16 for CSW to communicate with client and with [name], spouse of client, about current client needs and status.   Client sees [name] as primary care doctor. Client uses a walker to assist with ambulation. Client is receiving [facility] nursing support with RN [name]. Client is receiving home health nursing, physical therapy and occupational therapy through [place]. CSW spoke with   [name] regarding client care plan. CSW encouraged that client or [name] communicate with CSW in next 30 days to discuss community resources of assistance for client. Client does have lift chair in the home to use as needed.  CSW spoke with [name] about Advanced Directives packet mailed to client. [name] reported that client and she did receive brochures on advanced directives information but did not receive advanced directives booklet with blank forms and information on advanced directives. CSW to contact Freda Jackson, Case Management Assistant, to request that Legrand Como mail client advanced directives packet for review. Client is not having pain issues at this time. [name] said that client is receiving home health services as ordered through [place]. [name] said that physical therapy sessions for client through [place] were helpful to client.  [name] said she also has some support for client with her son and with her grandson. CSW and [name] spoke of community resources of assistance for client.  Client has his next appointment with [name] in 3 weeks. [name] said that client also sees [name], cardiologist, as scheduled. CSW thanked client and [name] for phone call with CSW on 12/14/16. CSW encouraged that client or [name] please call me at [phone] as needed to discuss social work needs of client.   Plan:  Client or Kalief Kattner to communicate with CSW in next 30 days to  discuss community resources of assistance for client.  CSW to collaborate, as needed, with RN [name] in monitoring needs of client.  CSW to call client or Alim Cattell, spouse of client ,in 4 weeks to assess client needs at that time.  [name].Quindon Denker MSW, LCSW Licensed Clinical Social Worker [facility] Care Management [phone]

## 2016-12-15 DIAGNOSIS — K625 Hemorrhage of anus and rectum: Secondary | ICD-10-CM | POA: Diagnosis not present

## 2016-12-15 DIAGNOSIS — N400 Enlarged prostate without lower urinary tract symptoms: Secondary | ICD-10-CM | POA: Diagnosis not present

## 2016-12-15 DIAGNOSIS — R339 Retention of urine, unspecified: Secondary | ICD-10-CM | POA: Diagnosis not present

## 2016-12-15 DIAGNOSIS — K219 Gastro-esophageal reflux disease without esophagitis: Secondary | ICD-10-CM | POA: Diagnosis not present

## 2016-12-15 DIAGNOSIS — I482 Chronic atrial fibrillation: Secondary | ICD-10-CM | POA: Diagnosis not present

## 2016-12-15 DIAGNOSIS — E785 Hyperlipidemia, unspecified: Secondary | ICD-10-CM | POA: Diagnosis not present

## 2016-12-15 DIAGNOSIS — I5032 Chronic diastolic (congestive) heart failure: Secondary | ICD-10-CM | POA: Diagnosis not present

## 2016-12-15 DIAGNOSIS — Z7901 Long term (current) use of anticoagulants: Secondary | ICD-10-CM | POA: Diagnosis not present

## 2016-12-15 DIAGNOSIS — E1142 Type 2 diabetes mellitus with diabetic polyneuropathy: Secondary | ICD-10-CM | POA: Diagnosis not present

## 2016-12-20 DIAGNOSIS — I482 Chronic atrial fibrillation: Secondary | ICD-10-CM | POA: Diagnosis not present

## 2016-12-20 DIAGNOSIS — I5032 Chronic diastolic (congestive) heart failure: Secondary | ICD-10-CM | POA: Diagnosis not present

## 2016-12-20 DIAGNOSIS — N400 Enlarged prostate without lower urinary tract symptoms: Secondary | ICD-10-CM | POA: Diagnosis not present

## 2016-12-20 DIAGNOSIS — K219 Gastro-esophageal reflux disease without esophagitis: Secondary | ICD-10-CM | POA: Diagnosis not present

## 2016-12-20 DIAGNOSIS — Z7901 Long term (current) use of anticoagulants: Secondary | ICD-10-CM | POA: Diagnosis not present

## 2016-12-20 DIAGNOSIS — E1142 Type 2 diabetes mellitus with diabetic polyneuropathy: Secondary | ICD-10-CM | POA: Diagnosis not present

## 2016-12-20 DIAGNOSIS — K625 Hemorrhage of anus and rectum: Secondary | ICD-10-CM | POA: Diagnosis not present

## 2016-12-20 DIAGNOSIS — E785 Hyperlipidemia, unspecified: Secondary | ICD-10-CM | POA: Diagnosis not present

## 2016-12-20 DIAGNOSIS — R339 Retention of urine, unspecified: Secondary | ICD-10-CM | POA: Diagnosis not present

## 2016-12-22 DIAGNOSIS — I5032 Chronic diastolic (congestive) heart failure: Secondary | ICD-10-CM | POA: Diagnosis not present

## 2016-12-22 DIAGNOSIS — Z7901 Long term (current) use of anticoagulants: Secondary | ICD-10-CM | POA: Diagnosis not present

## 2016-12-22 DIAGNOSIS — N400 Enlarged prostate without lower urinary tract symptoms: Secondary | ICD-10-CM | POA: Diagnosis not present

## 2016-12-22 DIAGNOSIS — K625 Hemorrhage of anus and rectum: Secondary | ICD-10-CM | POA: Diagnosis not present

## 2016-12-22 DIAGNOSIS — E785 Hyperlipidemia, unspecified: Secondary | ICD-10-CM | POA: Diagnosis not present

## 2016-12-22 DIAGNOSIS — K219 Gastro-esophageal reflux disease without esophagitis: Secondary | ICD-10-CM | POA: Diagnosis not present

## 2016-12-22 DIAGNOSIS — R339 Retention of urine, unspecified: Secondary | ICD-10-CM | POA: Diagnosis not present

## 2016-12-22 DIAGNOSIS — I482 Chronic atrial fibrillation: Secondary | ICD-10-CM | POA: Diagnosis not present

## 2016-12-22 DIAGNOSIS — E1142 Type 2 diabetes mellitus with diabetic polyneuropathy: Secondary | ICD-10-CM | POA: Diagnosis not present

## 2017-01-03 ENCOUNTER — Other Ambulatory Visit: Payer: Self-pay | Admitting: *Deleted

## 2017-01-03 NOTE — Patient Outreach (Signed)
RN CM called pt to remind of today's scheduled home visit, spoke with patient's wife [name], HIPAA verified, [name] states " there's a lot going on here today and this is not a good day for you to visit"  [name] requests that RN CM call back on another day to reschedule.  PLAN Call pt back this week to reschedule home visit  [name] [facility], [place] Coordinator [phone]

## 2017-01-05 ENCOUNTER — Other Ambulatory Visit: Payer: Self-pay | Admitting: *Deleted

## 2017-01-05 NOTE — Patient Outreach (Signed)
Telephone call to patient to reschedule home visit, spoke with patient's wife [name] and home visit scheduled for next week.  [name] [facility], [place] Coordinator [phone]

## 2017-01-10 ENCOUNTER — Other Ambulatory Visit: Payer: Self-pay | Admitting: *Deleted

## 2017-01-10 ENCOUNTER — Encounter: Payer: Self-pay | Admitting: *Deleted

## 2017-01-10 NOTE — Patient Outreach (Signed)
[facility]) Care Management   01/10/2017  Walter [name] 1932-04-08 [id number]  Walter [name] is an 83 y.o. male  Subjective: Routine home visit with pt, wife present, HIPAA verified, pt states " sometimes my bottom gets sore where I sit here a lot"  Wife states pt has had small open areas in the past and she applies neosporin ointment.  Pt continues weighing daily,  Home health has discharged pt.  Flomax increased which has helped with urinary symptoms and pt is now sleeping throughout the night.    Objective: Vitals:   01/10/17 1349  BP: 112/60  Pulse: 65  Resp: 16  SpO2: 94%  Weight: 231 lb (104.8 kg)      ROS  Physical Exam  Encounter Medications:   Outpatient Encounter Prescriptions as of 01/10/2017  Medication Sig Note  . ARTIFICIAL TEARS ophthalmic solution Place 1 drop into the left eye 2 (two) times daily.    Marland Kitchen aspirin 325 MG EC tablet Take 325 mg by mouth daily.   . brimonidine (ALPHAGAN P) 0.1 % SOLN Place 1 drop into the right eye 2 (two) times daily.     . carvedilol (COREG) 25 MG tablet Take 12.5 mg by mouth 2 (two) times daily with a meal.    . diazepam (VALIUM) 5 MG tablet Take 2.5 mg by mouth every 12 (twelve) hours as needed.     Marland Kitchen escitalopram (LEXAPRO) 10 MG tablet Take 1 tablet by mouth daily. 10/14/2015: Received from: External Pharmacy Received Sig:   . fluticasone (FLONASE) 50 MCG/ACT nasal spray Place 2 sprays into both nostrils 2 (two) times daily. 10/14/2015: Received from: External Pharmacy Received Sig:   . furosemide (LASIX) 40 MG tablet Take 80 mg by mouth 2 (two) times daily.    Marland Kitchen gabapentin (NEURONTIN) 300 MG capsule Take 300 mg by mouth 2 (two) times daily. Take one in morning and two in evening   . latanoprost (XALATAN) 0.005 % ophthalmic solution Place into both eyes at bedtime.   Marland Kitchen omeprazole (PRILOSEC OTC) 20 MG tablet Take 20 mg by mouth daily.     . simvastatin (ZOCOR) 40 MG tablet Take 20 mg by mouth at bedtime.    .  Tamsulosin HCl (FLOMAX) 0.4 MG CAPS Take 0.4 mg by mouth 2 (two) times daily.    . ciprofloxacin (CIPRO) 500 MG tablet Take 500 mg by mouth 2 (two) times daily.    No facility-administered encounter medications on file as of 01/10/2017.     Functional Status:   In your present state of health, do you have any difficulty performing the following activities: 12/01/2016  Hearing? Y  Vision? Y  Difficulty concentrating or making decisions? Y  Walking or climbing stairs? Y  Dressing or bathing? Y  Doing errands, shopping? Y  Preparing Food and eating ? Y  Using the Toilet? N  In the past six months, have you accidently leaked urine? N  Do you have problems with loss of bowel control? N  Managing your Medications? Y  Managing your Finances? Y  Housekeeping or managing your Housekeeping? Y  Some recent data might be hidden    Fall/Depression Screening:    Fall Risk  12/01/2016 11/28/2016  Falls in the past year? Yes Yes  Number falls in past yr: 2 or more 2 or more  Injury with Fall? No Yes  Risk Factor Category  High Fall Risk High Fall Risk  Risk for fall due to : History of fall(s);Impaired  balance/gait;Impaired mobility History of fall(s);Impaired balance/gait;Impaired mobility;Mental status change;Medication side effect;Impaired vision  Follow up Falls prevention discussed Falls evaluation completed;Falls prevention discussed   PHQ 2/9 Scores 12/01/2016 11/28/2016  PHQ - 2 Score 2 -  PHQ- 9 Score 8 -  Exception Documentation - Other- indicate reason in comment box  Not completed - call completed with spouse    Assessment:  RN CM reinforced CHF action plan, safety precautions, importance of adequate protein in diet, changing positions q 2 hours and keeping skin clean and dry, wife to apply barrier cream and will obtain gel cushion for recliner- pt sits in most of the day and has not been changing positions q 2 hours or standing up. RN CM faxed update to primary MD [name] and reported  small superficial scratched area to right buttock.  THN CM Care Plan Problem One     Most Recent Value  Care Plan Problem One  Pt high risk for falls  Role Documenting the Problem One  Care Management [place] for Problem One  Active  THN Long Term Goal   Pt will have no falls within 60 days  THN Long Term Goal Start Date  12/01/16  Interventions for Problem One Long Term Goal  Home health PT worked with pt and has now discharged pt, pt is doing prescribed exercises per wife  THN CM Short Term Goal #1   Pt / family will demonstrate safety precautions (using walker, assisting pt, etc) within 30 days  THN CM Short Term Goal #1 Start Date  12/01/16  [facility] CM Short Term Goal #1 Met Date  01/10/17  Interventions for Short Term Goal #1  RN CM reviewed importance of doing prescribed exercises daily, ask pt to stand up at least every 2 hours and walk short distances, change positions q 2 hours    [facility] CM Care Plan Problem Two     Most Recent Value  Care Plan Problem Two  Knowledge deficit related to CHF  Role Documenting the Problem Two  Care Management [place] for Problem Two  Active  THN CM Short Term Goal #1   Pt will verbalize CHF zones within 30 days.  THN CM Short Term Goal #1 Start Date  12/01/16  Interventions for Short Term Goal #2   RN CM reviewed CHF symptom management and action plan    [facility] CM Care Plan Problem Three     Most Recent Value  Care Plan Problem Three  Pt high risk for alteration in skin integrity  Role Documenting the Problem Three  Care Management Coordinator  Care Plan for Problem Three  Active  THN CM Short Term Goal #1   Pt will have no skin breakdown within 30 days  THN CM Short Term Goal #1 Start Date  01/10/17  Interventions for Short Term Goal #1  RN CM ask pt to change positions q 2 hours to prevent skin breakdown, wife to apply barrier cream and will obtain gel cushion.  Pt has had superficial breakdown in the past, RN CM observed  buttocks and small (less than .50cm round) scratched superficial area to right buttock, no drainage noted, wife applied neosporin ointment     Plan: call pt next week for follow up on skin integrity, ask if pt walking more and changing positions See pt for home visit next month beginning of October  [name] [facility], [place] Coordinator [phone]

## 2017-01-15 ENCOUNTER — Other Ambulatory Visit: Payer: Self-pay | Admitting: *Deleted

## 2017-01-15 NOTE — Patient Outreach (Signed)
Telephone call to pt for follow up on skin integrity, spoke with wife [name], HIPAA verified, [name] reports " we got the gel cushion and barrier cream and his skin looks so much better"  [name] reports no other issues or concerns.  RN CM reminded [name] of importance of pt changing positions q 2 hours.  PLAN Follow up with home visit in October  [name] [facility], [place] Coordinator [phone]

## 2017-01-16 ENCOUNTER — Other Ambulatory Visit: Payer: Self-pay | Admitting: Licensed Clinical Social Worker

## 2017-01-16 NOTE — Patient Outreach (Signed)
Assessment:  CSW spoke via phone with Walter [name], spouse of client. CSW verified identity of Walter [name]. CSW received verbal permission from Walter [name] on 01/16/17 for CSW to communicate with Walter [name] regarding client needs and status. Client sees Walter [name] as primary care doctor. [name] said client has prescribed medications and is taking medications as prescribed. Client is using a walker to assist client with ambulation. Client receives [facility] nursing support with RN Walter [name]. Client had been receiving home health nursing services, home health physical therapy services and home health occupational therapy services through [facility]. Client has now completed all home health services with [facility]. CSW spoke with Walter [name] about client care plan. CSW encouraged that client or Walter [name] communicate with CSW in next 30 days to discuss community resources of assistance for client. Client is not reporting any pain issues. Client is receiving some support also from his son and from his grandson. [name] reported that client recently obtained a gel cushion to use to help client with skin integrity issue. Client is trying to attend scheduled client medical appointments.  Walter [name] said that client has a scheduled appointment with Walter [name] next week. She said client had a recent appointment with Walter [name], cardiologist. CSW spoke with Walter [name] about Walter [name] program support in pharmacy, nursing and social work. CSW encouraged that client or Walter [name] call CSW at [phone] as needed to discuss social work needs of client. CSW thanked Walter [name] for phone call with CSW on 01/16/17. Walter [name] was appreciative of phone call from Walter [name] on 01/16/17.   Plan:  Client or Walter [name] to communicate with CSW in next 30 days to discuss community resources of assistance for client.  CSW to collaborate as needed with RN Walter [name] in monitoring needs of client.  CSW to call client or Walter [name] in 4 weeks to assess client needs at  that time.  Walter [name] MSW, LCSW Licensed Clinical Social Worker [facility] Care Management [phone]

## 2017-01-17 DIAGNOSIS — I10 Essential (primary) hypertension: Secondary | ICD-10-CM | POA: Diagnosis not present

## 2017-01-17 DIAGNOSIS — J189 Pneumonia, unspecified organism: Secondary | ICD-10-CM | POA: Diagnosis not present

## 2017-01-17 DIAGNOSIS — I482 Chronic atrial fibrillation: Secondary | ICD-10-CM | POA: Diagnosis not present

## 2017-01-17 DIAGNOSIS — E782 Mixed hyperlipidemia: Secondary | ICD-10-CM | POA: Diagnosis not present

## 2017-01-17 DIAGNOSIS — R062 Wheezing: Secondary | ICD-10-CM | POA: Diagnosis not present

## 2017-01-17 DIAGNOSIS — E1142 Type 2 diabetes mellitus with diabetic polyneuropathy: Secondary | ICD-10-CM | POA: Diagnosis not present

## 2017-01-17 DIAGNOSIS — E1165 Type 2 diabetes mellitus with hyperglycemia: Secondary | ICD-10-CM | POA: Diagnosis not present

## 2017-01-22 DIAGNOSIS — K625 Hemorrhage of anus and rectum: Secondary | ICD-10-CM | POA: Diagnosis not present

## 2017-01-22 DIAGNOSIS — I5032 Chronic diastolic (congestive) heart failure: Secondary | ICD-10-CM | POA: Diagnosis not present

## 2017-01-22 DIAGNOSIS — R339 Retention of urine, unspecified: Secondary | ICD-10-CM | POA: Diagnosis not present

## 2017-01-22 DIAGNOSIS — I482 Chronic atrial fibrillation: Secondary | ICD-10-CM | POA: Diagnosis not present

## 2017-01-25 DIAGNOSIS — R0602 Shortness of breath: Secondary | ICD-10-CM | POA: Diagnosis not present

## 2017-01-25 DIAGNOSIS — K5732 Diverticulitis of large intestine without perforation or abscess without bleeding: Secondary | ICD-10-CM | POA: Diagnosis not present

## 2017-01-25 DIAGNOSIS — I255 Ischemic cardiomyopathy: Secondary | ICD-10-CM | POA: Diagnosis not present

## 2017-01-25 DIAGNOSIS — I482 Chronic atrial fibrillation: Secondary | ICD-10-CM | POA: Diagnosis not present

## 2017-01-25 DIAGNOSIS — Z23 Encounter for immunization: Secondary | ICD-10-CM | POA: Diagnosis not present

## 2017-01-25 DIAGNOSIS — I5023 Acute on chronic systolic (congestive) heart failure: Secondary | ICD-10-CM | POA: Diagnosis not present

## 2017-02-01 ENCOUNTER — Encounter: Payer: Self-pay | Admitting: *Deleted

## 2017-02-01 ENCOUNTER — Other Ambulatory Visit: Payer: Self-pay | Admitting: *Deleted

## 2017-02-01 NOTE — Patient Outreach (Signed)
[facility]) Care Management   02/01/2017  Yee Joss Schnider 18-Apr-1932 [id number]  Achille Xiang Wesche is an 83 y.o. male  Subjective: Routine home visit with pt, HIPAA verified, patient's wife [name] present, reports pt went to primary MD last week and has confirmation now of diagnosis of alzheimers late onset, wife states she is reading book recommended by primary MD - "36 hour day" and states book is "very informative"  Wife states " we still want to go to [place] and apply for medicaid"   Reports medication changes decrease in lasix and carvedilol, no falls reported, states " skin is all healed up"    Objective:    Vitals:   02/01/17 1254  BP: 128/66  Pulse: 70  Resp: 18  SpO2: 96%  Weight: 228 lb (103.4 kg)   ROS  Physical Exam  Constitutional: He appears well-developed and well-nourished.  HENT:  Head: Normocephalic.  Neck: Normal range of motion. Neck supple.  Cardiovascular: Normal rate.   Respiratory: Effort normal and breath sounds normal.  GI: Soft. Bowel sounds are normal.  Musculoskeletal:  Dependent edema lower extremities bil  Skin: Skin is warm and dry.  Psychiatric:  Pt has forgetfulness, confusion at times Pt alert and oriented x 3 today    Encounter Medications:   Outpatient Encounter Prescriptions as of 02/01/2017  Medication Sig Note  . ARTIFICIAL TEARS ophthalmic solution Place 1 drop into the left eye 2 (two) times daily.    Marland Kitchen aspirin 325 MG EC tablet Take 325 mg by mouth daily.   . brimonidine (ALPHAGAN P) 0.1 % SOLN Place 1 drop into the right eye 2 (two) times daily.     . carvedilol (COREG) 25 MG tablet Take 6.25 mg by mouth 2 (two) times daily with a meal.    . diazepam (VALIUM) 5 MG tablet Take 2.5 mg by mouth every 12 (twelve) hours as needed.     Marland Kitchen escitalopram (LEXAPRO) 10 MG tablet Take 1 tablet by mouth daily. 10/14/2015: Received from: External Pharmacy Received Sig:   . fluticasone (FLONASE) 50 MCG/ACT nasal spray Place 2 sprays into  both nostrils 2 (two) times daily. 10/14/2015: Received from: External Pharmacy Received Sig:   . furosemide (LASIX) 40 MG tablet Take 40 mg by mouth 2 (two) times daily.    Marland Kitchen gabapentin (NEURONTIN) 300 MG capsule Take 300 mg by mouth 2 (two) times daily. Take one in morning and two in evening   . latanoprost (XALATAN) 0.005 % ophthalmic solution Place into both eyes at bedtime.   Marland Kitchen omeprazole (PRILOSEC OTC) 20 MG tablet Take 20 mg by mouth daily.     . simvastatin (ZOCOR) 40 MG tablet Take 20 mg by mouth at bedtime.    . Tamsulosin HCl (FLOMAX) 0.4 MG CAPS Take 0.4 mg by mouth 2 (two) times daily.    . [DISCONTINUED] ciprofloxacin (CIPRO) 500 MG tablet Take 500 mg by mouth 2 (two) times daily.    No facility-administered encounter medications on file as of 02/01/2017.     Functional Status:   In your present state of health, do you have any difficulty performing the following activities: 12/01/2016  Hearing? Y  Vision? Y  Difficulty concentrating or making decisions? Y  Walking or climbing stairs? Y  Dressing or bathing? Y  Doing errands, shopping? Y  Preparing Food and eating ? Y  Using the Toilet? N  In the past six months, have you accidently leaked urine? N  Do you have problems  with loss of bowel control? N  Managing your Medications? Y  Managing your Finances? Y  Housekeeping or managing your Housekeeping? Y  Some recent data might be hidden    Fall/Depression Screening:    Fall Risk  02/01/2017 12/01/2016 11/28/2016  Falls in the past year? Yes Yes Yes  Number falls in past yr: 2 or more 2 or more 2 or more  Injury with Fall? No No Yes  Risk Factor Category  High Fall Risk High Fall Risk High Fall Risk  Risk for fall due to : History of fall(s);Impaired mobility;Impaired balance/gait;Mental status change;Medication side effect History of fall(s);Impaired balance/gait;Impaired mobility History of fall(s);Impaired balance/gait;Impaired mobility;Mental status change;Medication side  effect;Impaired vision  Follow up Falls evaluation completed;Education provided;Falls prevention discussed Falls prevention discussed Falls evaluation completed;Falls prevention discussed   PHQ 2/9 Scores 12/01/2016 11/28/2016  PHQ - 2 Score 2 -  PHQ- 9 Score 8 -  Exception Documentation - Other- indicate reason in comment box  Not completed - call completed with spouse    Assessment:  RN CM reviewed new diagnosis of alzheimers late onset, RN CM reviewed disease process with wife and importance of support group.(wife not sure if she wants to attend a support group at present)   Pt still unable to weigh at home.  RN CM reviewed after visit summary from primary MD appointment last week and reviewed medications and changes with wife.  RN CM sent In basket to [place] reporting pt interested in applying for medicaid and plans to go to DSS and would like a list of the documents she needs to take, reported pt has new diagnosis Alzheimers and could use support, resources.    THN CM Care Plan Problem One     Most Recent Value  Care Plan Problem One  Pt high risk for falls  Role Documenting the Problem One  Care Management [place] for Problem One  Active  THN Long Term Goal   Pt will have no falls within 60 days  THN Long Term Goal Start Date  02/01/17 [goal restarted- needs reinforcement]  Interventions for Problem One Long Term Goal  Pt continues to be high risk for falls, RN CM reviewed safety precautions, importance of asking for assistance as needed  THN CM Short Term Goal #1   Pt / family will demonstrate safety precautions (using walker, assisting pt, etc) within 30 days  THN CM Short Term Goal #1 Start Date  12/01/16  [facility] CM Short Term Goal #1 Met Date  01/10/17    [facility] CM Care Plan Problem Two     Most Recent Value  Care Plan Problem Two  Knowledge deficit related to CHF  Role Documenting the Problem Two  Care Management Coordinator  Care Plan for Problem Two   Active  THN CM Short Term Goal #1   Pt will verbalize CHF zones within 30 days.  THN CM Short Term Goal #1 Start Date  12/01/16  [facility] CM Short Term Goal #1 Met Date   02/01/17  Interventions for Short Term Goal #2   RN CM reinforced CHF symptom management and action plan    [facility] CM Care Plan Problem Three     Most Recent Value  Care Plan Problem Three  Pt high risk for alteration in skin integrity  Role Documenting the Problem Three  Care Management [place] for Problem Three  Active  THN CM Short Term Goal #1   Pt  will have no skin breakdown within 30 days  THN CM Short Term Goal #1 Start Date  01/10/17  [facility] CM Short Term Goal #1 Met Date  02/01/17  Interventions for Short Term Goal #1  Pt now has gel cushion and using in recliner, no open breaks in skin, RN CM ask pt/ wife to continue changing positions q 2 hours, keep skin clean and dry      Plan: see pt for home visit next month related to new diagnosis and recent medication changes Collaborate with [facility] CSW as needed  [name] [facility] [place] Coordinator [phone]

## 2017-02-16 ENCOUNTER — Other Ambulatory Visit: Payer: Self-pay | Admitting: Licensed Clinical Social Worker

## 2017-02-16 NOTE — Patient Outreach (Signed)
Assessment:  CSW spoke via phone with Walter [name], spouse of client . CSW verified identity of Walter [name]. CSW received verbal permission from Walter [name] on 02/16/17 for CSW to speak with Walter [name] about current needs and status of client.  Client sees [name] as primary care doctor. Client has prescribed medications and is taking medications as prescribed. Client is using a walker to assist client in ambulation. Client receives [facility] nursing support with RN Walter [name]. CSW and Walter [name] spoke of client care plan. CSW encouraged that client or Walter [name] communicate with CSW in next 30 days to discuss community resources of assistance for client.  Client is also receiving some support from his son and from his grandson. Walter [name] said that client is attending all scheduled client medical appointments. Client sees [name], cardiologist, as scheduled. Client does have lift chair in the home to use as needed. Client had recent appointment with [name]. Client has Alzheimer's diagnosis, late onset.  Walter [name] said she is interested in going to [employer] [facility] [employer] [facility] to talk with [facility] caseworker about applying for Medicaid for client.  CSW and Walter [name] spoke of documents she would need to bring to a Medicaid application at [employer]. CSW spoke with Walter [name] about Medicaid application process and usual time line. Walter [name] was appreciative of information about Medicaid application for client.  Walter [name] said that client does not have any current pain issues but has difficulty ambulating and is sometimes confused. He has had several falls but no serous injuries from falls. Walter [name] said that client needs some assistance with activities of daily living.  Walter [name] said that her grandson helps also in home with addressing needs of client and helping client with daily tasks.  CSW thanked Walter [name] for phone call with CSW on 02/16/17.  CSW encouraged Walter [name] to call CSW at [phone] as needed to address social work needs of client.  Walter [name] was  appreciative of phone call from [place] on 02/16/17.      Plan:  Client or Walter [name] to communicate with CSW in next 30 days to discuss community resources of assistance for client.  CSW to collaborate with RN Walter [name] as needed in monitoring needs of client.  CSW to call client or Walter [name] in 4 weeks to assess client needs at that time.  [name].Sylvana Bonk MSW, LCSW Licensed Clinical Social Worker [facility] Care Management [phone]

## 2017-03-01 ENCOUNTER — Ambulatory Visit: Payer: Self-pay | Admitting: *Deleted

## 2017-03-02 ENCOUNTER — Encounter: Payer: Self-pay | Admitting: *Deleted

## 2017-03-02 ENCOUNTER — Other Ambulatory Visit: Payer: Self-pay | Admitting: *Deleted

## 2017-03-02 NOTE — Patient Outreach (Addendum)
[name]) Care Management   03/02/2017  Walter [name] 02-27-1932 [id number]  [name] Walter [name] is an 83 y.o. male  Subjective:  Routine home visit with pt, HIPAA verified, patient's wife present, reports " he's doing good, weight is good at 223 pounds"  Sleeping better, states few weeks ago pt got tripped up and fell, did not see MD and denies injury.  Pt still using walker and wife assists pt, also her adult son assists as needed.  Wife states " I still haven't called DSS about the medicaid but I'm going to" and reports she has been in contact with CSW about this.   Objective:   Vitals:   03/02/17 1410  BP: 112/60  Pulse: 66  Resp: 16  SpO2: 98%  Weight: 223 lb (101.2 kg)   ROS  Physical Exam  Constitutional: He is oriented to person, place, and time. He appears well-developed and well-nourished.  HENT:  Head: Normocephalic.  Neck: Normal range of motion. Neck supple.  Cardiovascular: Normal rate.   Respiratory: Effort normal and breath sounds normal.  GI: Soft. Bowel sounds are normal.  Musculoskeletal: Normal range of motion. He exhibits no edema.  Neurological: He is alert and oriented to person, place, and time.  Skin: Skin is warm and dry.  Dry skin on feet  Psychiatric: He has a normal mood and affect. His behavior is normal.  Confused at times, varies day to day    Encounter Medications:   Outpatient Encounter Prescriptions as of 03/02/2017  Medication Sig Note  . ARTIFICIAL TEARS ophthalmic solution Place 1 drop into the left eye 2 (two) times daily.    Marland Kitchen aspirin 325 MG EC tablet Take 325 mg by mouth daily.   . brimonidine (ALPHAGAN P) 0.1 % SOLN Place 1 drop into the right eye 2 (two) times daily.     . carvedilol (COREG) 25 MG tablet Take 6.25 mg by mouth 2 (two) times daily with a meal.    . diazepam (VALIUM) 5 MG tablet Take 2.5 mg by mouth every 12 (twelve) hours as needed.     Marland Kitchen escitalopram (LEXAPRO) 10 MG tablet Take 1 tablet by mouth  daily. 10/14/2015: Received from: External Pharmacy Received Sig:   . fluticasone (FLONASE) 50 MCG/ACT nasal spray Place 2 sprays into both nostrils 2 (two) times daily. 10/14/2015: Received from: External Pharmacy Received Sig:   . furosemide (LASIX) 40 MG tablet Take 40 mg by mouth daily.    Marland Kitchen gabapentin (NEURONTIN) 300 MG capsule Take 300 mg by mouth 2 (two) times daily. Take one in morning and two in evening   . latanoprost (XALATAN) 0.005 % ophthalmic solution Place into both eyes at bedtime.   Marland Kitchen omeprazole (PRILOSEC OTC) 20 MG tablet Take 20 mg by mouth daily.     . simvastatin (ZOCOR) 40 MG tablet Take 20 mg by mouth at bedtime.    . Tamsulosin HCl (FLOMAX) 0.4 MG CAPS Take 0.4 mg by mouth 2 (two) times daily.     No facility-administered encounter medications on file as of 03/02/2017.     Functional Status:   In your present state of health, do you have any difficulty performing the following activities: 12/01/2016  Hearing? Y  Vision? Y  Difficulty concentrating or making decisions? Y  Walking or climbing stairs? Y  Dressing or bathing? Y  Doing errands, shopping? Y  Preparing Food and eating ? Y  Using the Toilet? N  In the past six months, have  you accidently leaked urine? N  Do you have problems with loss of bowel control? N  Managing your Medications? Y  Managing your Finances? Y  Housekeeping or managing your Housekeeping? Y  Some recent data might be hidden    Fall/Depression Screening:    Fall Risk  02/01/2017 12/01/2016 11/28/2016  Falls in the past year? Yes Yes Yes  Number falls in past yr: 2 or more 2 or more 2 or more  Injury with Fall? No No Yes  Risk Factor Category  High Fall Risk High Fall Risk High Fall Risk  Risk for fall due to : History of fall(s);Impaired mobility;Impaired balance/gait;Mental status change;Medication side effect History of fall(s);Impaired balance/gait;Impaired mobility History of fall(s);Impaired balance/gait;Impaired mobility;Mental status  change;Medication side effect;Impaired vision  Follow up Falls evaluation completed;Education provided;Falls prevention discussed Falls prevention discussed Falls evaluation completed;Falls prevention discussed   PHQ 2/9 Scores 12/01/2016 11/28/2016  PHQ - 2 Score 2 -  PHQ- 9 Score 8 -  Exception Documentation - Other- indicate reason in comment box  Not completed - call completed with spouse    Assessment:  RN CM discussed discharge plan and explained [facility] health coach services, wife states not interested at present, wife has [facility] contact information and 24 hour nurse line if anything changes with patient's health status.  RN CM ask wife to make appointment with podiatrist to have patient's toenails trimmed and to keep feet/ skin moisturized. Wife feels that pt is as strong as he is going to be due to unable to exercise and does not walk much, wife verbalizes safety precautions, pathways are clear, pt is using walker.  RN CM sent update to primary MD [name], reported fall several weeks ago and pt denies injury.  RN CM sent in basket to [facility] reporting discharge from nursing services.  Plan: discharge pt today  [name] [facility], BSN [place] Coordinator [phone]

## 2017-03-20 ENCOUNTER — Other Ambulatory Visit: Payer: Self-pay | Admitting: Licensed Clinical Social Worker

## 2017-03-20 NOTE — Patient Outreach (Signed)
Assessment:  CSW spoke via phone with Walter [name], spouse of client. CSW verified identity of Walter [name]. CSW received verbal permission from [place] on 03/20/17 for CSW to speak with Walter [name] regarding client needs and status.Client sees [name] as primary care doctor. Client has prescribed medications as is taking medications as prescribed.  Client uses a walker to assist client in ambulation.  [facility]N RN [name] recently discharged client from [place].  CSW spoke with Walter [name] about client care plan.  CSW encouraged that client or Walter [name] communicate with CSW in next 30 days to discuss community resources of assistance for client.  [name] said client  is attending scheduled client medical appointments .  Client does have a lift chair in the home to use as needed. Client has Alzheimer's diagnosis, late onset.  CSW has previously talked with Walter [name] about Medicaid application process for client and documents needed for client Medicaid application.  Walter [name] has not yet contacted via phone or in person [employer] Medicaid caseworker to discuss Medicaid application.  Walter [name] said client  needs some assistance with activities of daily living.  Walter [name] said that her grandson helps also in the home with addressing needs of client.  Walter [name] said that client has not had any recent falls. Walter [name] said that client has appointment with [name] in January of 2019.  Client is not having any current pain issues.  Client uses walker to assist in ambulation.  Walter [name] said that client  has a wheelchair ordered and they are planning to go soon to pick up wheelchair for client from [employer]. Client is eating well. Client is sleeping adequately.  CSW again encouraged Walter [name] to call [employer] in [place], [place] to talk with Medicaid caseworker about Medicaid application process for client. CSW thanked Walter [name] for phone call with CSW on 03/20/17.  Walter [name] was appreciative of phone call from [place] on  03/20/17   Plan:  Client or Aubry Tucholski to communicate with CSW in next 30 days to discuss community resources of assistance for client.   CSW to call client or Ching [place] in 4 weeks to assess client needs at that time.[name].Darrelle Barrell MSW, LCSW Licensed Clinical Social Worker [facility] Care Management [phone]

## 2017-03-23 DIAGNOSIS — R296 Repeated falls: Secondary | ICD-10-CM | POA: Diagnosis not present

## 2017-03-23 DIAGNOSIS — G301 Alzheimer's disease with late onset: Secondary | ICD-10-CM | POA: Diagnosis not present

## 2017-03-23 DIAGNOSIS — G6289 Other specified polyneuropathies: Secondary | ICD-10-CM | POA: Diagnosis not present

## 2017-04-20 ENCOUNTER — Other Ambulatory Visit: Payer: Self-pay | Admitting: Licensed Clinical Social Worker

## 2017-04-20 NOTE — Patient Outreach (Signed)
Assessment:  CSW spoke via phone with  [name], spouse of cliient.  . CSW verified identity of Walter [name]. CSW received verbal permissoin from Walter [name] on 04/20/17 for CSW to speak with Walter [name]  about current client needs and status. Client sees [name] as primary care doctor. Client has prescribed medications and is taking medications as prescribed. Client had used a walker to assist client in ambulation.  Client has transport wheelchair to utilize. Client has no pain issues.  Client has appointment with [name] in January.of 2019.    CSW encouraged client or Walter [name] communicate with CSW in next 30 days to discuss community resources of assistance for client. [name] said client is eating well and sleeping well.  Walter [name] is very supportive of client. RN [name] recently discharged client from [place].  CSW reminded Walter [name] of [facility] program services in social work, pharmacy and nursing. CSW encouraged Walter [name] or client to call CSW at [phone] as needed to discuss social work needs of client. CSW thanked Walter [name] for phone call with CSW on 04/20/17.   Plan:  Client or spouse to communicate with CSW in next 30 days to discuss community resources of assistance for client.   CSW to call client or spouse of client in 4 weeks to assess client needs.  [name].[name] MSW, LCSW Licensed Clinical Social Worker [facility] Care Management [phone]

## 2017-04-22 DIAGNOSIS — R296 Repeated falls: Secondary | ICD-10-CM | POA: Diagnosis not present

## 2017-04-22 DIAGNOSIS — G301 Alzheimer's disease with late onset: Secondary | ICD-10-CM | POA: Diagnosis not present

## 2017-04-22 DIAGNOSIS — G6289 Other specified polyneuropathies: Secondary | ICD-10-CM | POA: Diagnosis not present

## 2017-05-21 DIAGNOSIS — I10 Essential (primary) hypertension: Secondary | ICD-10-CM | POA: Diagnosis not present

## 2017-05-21 DIAGNOSIS — G6289 Other specified polyneuropathies: Secondary | ICD-10-CM | POA: Diagnosis not present

## 2017-05-21 DIAGNOSIS — G301 Alzheimer's disease with late onset: Secondary | ICD-10-CM | POA: Diagnosis not present

## 2017-05-21 DIAGNOSIS — E1165 Type 2 diabetes mellitus with hyperglycemia: Secondary | ICD-10-CM | POA: Diagnosis not present

## 2017-05-21 DIAGNOSIS — E782 Mixed hyperlipidemia: Secondary | ICD-10-CM | POA: Diagnosis not present

## 2017-05-23 DIAGNOSIS — G6289 Other specified polyneuropathies: Secondary | ICD-10-CM | POA: Diagnosis not present

## 2017-05-23 DIAGNOSIS — G301 Alzheimer's disease with late onset: Secondary | ICD-10-CM | POA: Diagnosis not present

## 2017-05-23 DIAGNOSIS — R296 Repeated falls: Secondary | ICD-10-CM | POA: Diagnosis not present

## 2017-05-24 ENCOUNTER — Other Ambulatory Visit: Payer: Self-pay | Admitting: Licensed Clinical Social Worker

## 2017-05-24 NOTE — Patient Outreach (Signed)
Assessment:  CSW spoke via phone with Walter [name], spouse of client. CSW verified identity of Walter [name]. CSW received verbal permission from Walter [name] on 05/24/17 for CSW to speak with Walter [name] about client needs and status. Client sees Dr. Quillian [name] as primary care doctor.  Walter [name] said that client has prescribed medications and is taking medications as prescribed.  Client has a transport wheelchair to utilize for ambulation. Client also has a walker to assist client in ambulation. Regarding client care plan, CSW encouraged client or Walter [name] to communicate with CSW in next 30 days to discuss community resources of assistance for client. CSW has spoken with Walter [name] about [facility] program support in areas of pharmacy, social work and nursing. CSW has also talked with Walter [name] about Medicaid application process for client.  Client is scheduled to have appointment wiih Dr. Quillian [name] next Tuesday.  Client has no pain issues.  Client is eating adequately.  Client's weight is stable, per Walter [name]. Client is sleeping well. Client likes to listen to music to relax. Client likes playing computer games to relax. Client also likes watching sports events on TV.  Client sees [name], cardiologist, annually as scheduled.  Walter [name] said that family members help transport client to and from client's medical appointments. Walter [name] and CSW spoke of Medicaid application for client. Walter [name] said that client earns too much monthly income to qualify for Medicaid.  She said she has tried before to apply for Medicaid for client and has researched income limits. Again, she said client earns too much monthly income for client to qualify for Medicaid.  CSW gave Walter [name] CSW phone number of [phone] and encouraged Walter [name] to call CSW as needed to discuss social work needs of client.    Plan:  Client or Walter [name] to communicate with CSW in next 30 days to discuss community resources of assistance for client.    CSW to call client or Walter [name] in 4 weeks to assess client needs  at that time.  Walter [name].Walter [name] MSW, LCSW Licensed Clinical Social Worker [facility] Care Management [phone]

## 2017-05-25 ENCOUNTER — Ambulatory Visit: Payer: Self-pay | Admitting: Licensed Clinical Social Worker

## 2017-05-29 DIAGNOSIS — R0602 Shortness of breath: Secondary | ICD-10-CM | POA: Diagnosis not present

## 2017-05-29 DIAGNOSIS — I482 Chronic atrial fibrillation: Secondary | ICD-10-CM | POA: Diagnosis not present

## 2017-05-29 DIAGNOSIS — E782 Mixed hyperlipidemia: Secondary | ICD-10-CM | POA: Diagnosis not present

## 2017-05-29 DIAGNOSIS — I5023 Acute on chronic systolic (congestive) heart failure: Secondary | ICD-10-CM | POA: Diagnosis not present

## 2017-05-29 DIAGNOSIS — I255 Ischemic cardiomyopathy: Secondary | ICD-10-CM | POA: Diagnosis not present

## 2017-05-29 DIAGNOSIS — Z23 Encounter for immunization: Secondary | ICD-10-CM | POA: Diagnosis not present

## 2017-06-18 DIAGNOSIS — H401423 Capsular glaucoma with pseudoexfoliation of lens, left eye, severe stage: Secondary | ICD-10-CM | POA: Diagnosis not present

## 2017-06-18 DIAGNOSIS — H401411 Capsular glaucoma with pseudoexfoliation of lens, right eye, mild stage: Secondary | ICD-10-CM | POA: Diagnosis not present

## 2017-06-23 DIAGNOSIS — G301 Alzheimer's disease with late onset: Secondary | ICD-10-CM | POA: Diagnosis not present

## 2017-06-23 DIAGNOSIS — G6289 Other specified polyneuropathies: Secondary | ICD-10-CM | POA: Diagnosis not present

## 2017-06-23 DIAGNOSIS — R296 Repeated falls: Secondary | ICD-10-CM | POA: Diagnosis not present

## 2017-06-27 ENCOUNTER — Other Ambulatory Visit: Payer: Self-pay | Admitting: Licensed Clinical Social Worker

## 2017-06-27 NOTE — Patient Outreach (Signed)
Assessment:  CSW spoke via phone with Walter [name], spouse of client. CSW verified identity of Walter [name] CSW received verbal permission from [place] on 06/27/17 for CSW to speak with her about client needs and status. CSW has talked previously with Walter [name] about Medicaid application process for client. Walter [name] said that client earns too much monthly income to qualify for Medicaid for clinet.  CSW has talked with Walter [name] about relaxation techniques for client. Walter [name] said that client likes ot listen to music or play computer games to relax. Client enjoys watching sports activities on TV. CSW talked with Walter [name] about client use of these relaxation methods to help client manage stress of client.  As client care plan goal, CSW encouraged that client continue to use relaxation methods of choice in next 30 days to help client manage stress.  Walter [name] is very supportive of client. She said client had needed medications . CSW thanked Walter [name] for phone call with CSW on 06/27/17. CSW encouraged client or Walter [name] to call CSW at [phone] as needed to discuss social work needs of client.    Plan:  Client to use relaxation methods of choice (watching TV, listening to music, playing computer games) in next 30 days to help client manage stress of client.   CSW to call client or Walter [name] in 4 weeks to assess client needs.    Walter [name].Walter [name] MSW, LCSW Licensed Clinical Social Worker [facility] Care Management [phone]

## 2017-07-21 DIAGNOSIS — G6289 Other specified polyneuropathies: Secondary | ICD-10-CM | POA: Diagnosis not present

## 2017-07-21 DIAGNOSIS — G301 Alzheimer's disease with late onset: Secondary | ICD-10-CM | POA: Diagnosis not present

## 2017-07-21 DIAGNOSIS — R296 Repeated falls: Secondary | ICD-10-CM | POA: Diagnosis not present

## 2017-07-25 ENCOUNTER — Other Ambulatory Visit: Payer: Self-pay | Admitting: Licensed Clinical Social Worker

## 2017-07-25 NOTE — Patient Outreach (Signed)
Assessment:  CSW spoke via phone with [name], spouse of client. CSW verified identity of Lamel Mccarley. CSW received verbal permission from Kyston Gonce on 07/25/17 for CSW to speak with [name] about client needs.CSW spoke with [name] about client care plan. CSW encouraged that client continue to use relaxations methods of choice (watching TV, listening to music, playing computer games) in next 30 days to help client manage stress of client. CSW also talked with [name] about other ways that client liked to  manage stress on a regular basis. She said client likes talking via phone with family and friends, and likes having visits in his home with family or friends. She said client also likes to sit on their porch to enjoy nature and watch birds outdoors. CSW encouraged that client continue to use relaxation means of choice to help client manage stress and anxiety of client. CSW encouraged that client or [name] call CSW at [phone] as needed to discuss social work needs of client. [name] was appreciative of call from [place] on 07/25/17.    Plan:  Client to continue to use relaxation methods of choice (watching TV, listening to music, playing computer games) in  next 30 days to help client manage stress of client.   CSW to call client or Rakan Soffer in 4 weeks to assess client needs.    [name].Cordarrel Stiefel MSW, LCSW Licensed Clinical Social Worker [facility] Care Management [phone]

## 2017-07-26 DIAGNOSIS — R1084 Generalized abdominal pain: Secondary | ICD-10-CM | POA: Diagnosis not present

## 2017-08-10 ENCOUNTER — Encounter: Payer: Self-pay | Admitting: *Deleted

## 2017-08-13 ENCOUNTER — Encounter: Payer: Self-pay | Admitting: Cardiology

## 2017-08-13 ENCOUNTER — Ambulatory Visit: Payer: Medicare Other | Admitting: Cardiology

## 2017-08-13 VITALS — BP 120/70 | HR 83 | Ht 72.0 in | Wt 234.6 lb

## 2017-08-13 DIAGNOSIS — I4891 Unspecified atrial fibrillation: Secondary | ICD-10-CM | POA: Diagnosis not present

## 2017-08-13 DIAGNOSIS — I5022 Chronic systolic (congestive) heart failure: Secondary | ICD-10-CM

## 2017-08-13 DIAGNOSIS — I2510 Atherosclerotic heart disease of native coronary artery without angina pectoris: Secondary | ICD-10-CM

## 2017-08-13 DIAGNOSIS — I6523 Occlusion and stenosis of bilateral carotid arteries: Secondary | ICD-10-CM | POA: Diagnosis not present

## 2017-08-13 DIAGNOSIS — R0602 Shortness of breath: Secondary | ICD-10-CM | POA: Diagnosis not present

## 2017-08-13 NOTE — Progress Notes (Signed)
Clinical Summary Walter [name] is a 84 y.o.male seen today for follow up of the following medical problems.   1. CAD - history of prior CABG - nuclear stress 07/2010 with lateral and anterolateral scar, no active ischemia.    - no chest pain - compliant with meds   2. Chronic systolic HF - LVEF 41-32% by echo 07/2010 - echo 04/2011 LVEF 45-50%, abnormal diastolic function   - has had some SOB. Intermittent symptoms. Can affect him while he is laying down.  - no recent LE edema. Actually down 10 lbs since last visit.  - no chest pain.  - no ACE-I due to poor renal function - some mucous production, cough at times  3. Hyperlipidemia - compliant statin  4. HTN - does not check at home.  - compliatn with meds  5. Afib  - no recent palpitaitons.  - pcp stopped xarelto due to bleeding from mouth, penis, recutm per family report   75. Carotid stenosis  09/2016 mild bilateral disease - no symptoms  Past Medical History:  Diagnosis Date  . Anxiety   . Aortic valve sclerosis    No significant stenosis, echo  . Atrial fibrillation (HCC)    Atrial fibrillation/flutter, rate control, Coumadin  . CAD (coronary artery disease)    Nuclear, August, 2005, extensive lateral scar, no ischemia /nuclear, August 09, 2010, large lateral and anterolateral scar with no ischemia, ejection fraction 26%  . Carotid artery disease [facility])    Dr. Scot Dock, Doppler, August, 4401, 02-72% LICA  . CHF (congestive heart failure) (HCC)    Systolic  . Chronic anticoagulation    Pradaxa  . CVA (cerebral infarction)    April, 2011 likely cardioembolic  . Diabetes mellitus    Diagnosis April, 2011  . Dyslipidemia   . Ejection fraction     EF 35-40%, echo, August 10, 2010  /      40-45% (better than before) Echo, January, 2010, ICD not indicated / EF 40%, echo, April, 2011  . GERD (gastroesophageal reflux disease)   . Hx of CABG    1998  . Hypertension   . Ischemic cardiomyopathy    Ischemic  . Leg weakness    Arterial leg Dopplers normal, 2009  . Mitral regurgitation    Mild, echo, January, 2010  . Peripheral neuropathy   . Positive D-dimer    D-dimer elevated, hospital, April, 2012, chest CT no pulmonary embolus  . PVC's (premature ventricular contractions)      No Known Allergies   Current Outpatient Medications  Medication Sig Dispense Refill  . ARTIFICIAL TEARS ophthalmic solution Place 1 drop into the left eye 2 (two) times daily.     Marland Kitchen aspirin 325 MG EC tablet Take 325 mg by mouth daily.    . brimonidine (ALPHAGAN P) 0.1 % SOLN Place 1 drop into the right eye 2 (two) times daily.      . carvedilol (COREG) 25 MG tablet Take 6.25 mg by mouth 2 (two) times daily with a meal.     . diazepam (VALIUM) 5 MG tablet Take 2.5 mg by mouth every 12 (twelve) hours as needed.      Marland Kitchen escitalopram (LEXAPRO) 10 MG tablet Take 1 tablet by mouth daily.    . fluticasone (FLONASE) 50 MCG/ACT nasal spray Place 2 sprays into both nostrils 2 (two) times daily.    . furosemide (LASIX) 40 MG tablet Take 40 mg by mouth daily.     Marland Kitchen gabapentin (NEURONTIN)  300 MG capsule Take 300 mg by mouth 2 (two) times daily. Take one in morning and two in evening    . latanoprost (XALATAN) 0.005 % ophthalmic solution Place into both eyes at bedtime.    Marland Kitchen omeprazole (PRILOSEC OTC) 20 MG tablet Take 20 mg by mouth daily.      . simvastatin (ZOCOR) 40 MG tablet Take 20 mg by mouth at bedtime.     . Tamsulosin HCl (FLOMAX) 0.4 MG CAPS Take 0.4 mg by mouth 2 (two) times daily.      No current facility-administered medications for this visit.      Past Surgical History:  Procedure Laterality Date  . CORONARY ARTERY BYPASS GRAFT  1998     No Known Allergies    Family History  Problem Relation Age of Onset  . Hypertension Unknown   . Diabetes Unknown   . CAD Unknown      Social History Walter [name] reports that he quit smoking about 35 years ago. His smoking use included cigarettes. He  started smoking about 50 years ago. He has a 22.50 pack-year smoking history. He has never used smokeless tobacco. Walter [name] reports that he does not drink alcohol.   Review of Systems CONSTITUTIONAL: No weight loss, fever, chills, weakness or fatigue.  HEENT: Eyes: No visual loss, blurred vision, double vision or yellow sclerae.No hearing loss, sneezing, congestion, runny nose or sore throat.  SKIN: No rash or itching.  CARDIOVASCULAR: per hpi RESPIRATORY: No shortness of breath, cough or sputum.  GASTROINTESTINAL: No anorexia, nausea, vomiting or diarrhea. No abdominal pain or blood.  GENITOURINARY: No burning on urination, no polyuria NEUROLOGICAL: No headache, dizziness, syncope, paralysis, ataxia, numbness or tingling in the extremities. No change in bowel or bladder control.  MUSCULOSKELETAL: No muscle, back pain, joint pain or stiffness.  LYMPHATICS: No enlarged nodes. No history of splenectomy.  PSYCHIATRIC: No history of depression or anxiety.  ENDOCRINOLOGIC: No reports of sweating, cold or heat intolerance. No polyuria or polydipsia.  Marland Kitchen   Physical Examination Vitals:   08/13/17 1610  BP: 120/70  Pulse: 83  SpO2: 97%   Vitals:   08/13/17 1610  Weight: 234 lb 9.6 oz (106.4 kg)  Height: 6' (1.829 m)    Gen: resting comfortably, no acute distress HEENT: no scleral icterus, pupils equal round and reactive, no palptable cervical adenopathy,  CV: RRR, no mr/g, no jvd Resp: Clear to auscultation bilaterally GI: abdomen is soft, non-tender, non-distended, normal bowel sounds, no hepatosplenomegaly MSK: extremities are warm, no edema.  Skin: warm, no rash Neuro:  no focal deficits Psych: appropriate affect      Assessment and Plan  1. CAD - no chest pain, continue current meds  2. Chronic systolic HF - some increased SOB, orthopnea -we will repeat echo to eval for change in cardiac function  3. Carotid stenosis - mild bilateral disease, continue to monitor.     4. Hyperlipidemia - continue statin   5. HTN - at goal, continue current meds  6. Afib - no symptoms. EKG today shows rate controlled afib - taken off anticoag by pcp due to recurrent bleeding    F/u 6 months        Arnoldo Lenis, M.D.

## 2017-08-13 NOTE — Patient Instructions (Signed)
Medication Instructions:  Your physician recommends that you continue on your current medications as directed. Please refer to the Current Medication list given to you today.  Labwork: NONE  Testing/Procedures: Your physician has requested that you have an echocardiogram. Echocardiography is a painless test that uses sound waves to create images of your heart. It provides your doctor with information about the size and shape of your heart and how well your heart's chambers and valves are working. This procedure takes approximately one hour. There are no restrictions for this procedure.   Follow-Up: Your physician wants you to follow-up in: 6 MONTHS WITH DR. BRANCH You will receive a reminder letter in the mail two months in advance. If you don't receive a letter, please call our office to schedule the follow-up appointment.  Any Other Special Instructions Will Be Listed Below (If Applicable).  If you need a refill on your cardiac medications before your next appointment, please call your pharmacy. 

## 2017-08-17 ENCOUNTER — Other Ambulatory Visit: Payer: Self-pay | Admitting: *Deleted

## 2017-08-17 ENCOUNTER — Encounter: Payer: Self-pay | Admitting: *Deleted

## 2017-08-17 ENCOUNTER — Telehealth: Payer: Self-pay | Admitting: Cardiology

## 2017-08-17 DIAGNOSIS — I5042 Chronic combined systolic (congestive) and diastolic (congestive) heart failure: Secondary | ICD-10-CM

## 2017-08-17 DIAGNOSIS — R0602 Shortness of breath: Secondary | ICD-10-CM

## 2017-08-17 NOTE — Telephone Encounter (Signed)
Echo scheduled Walter [name] August 27, 2017

## 2017-08-20 ENCOUNTER — Encounter: Payer: Self-pay | Admitting: Cardiology

## 2017-08-21 DIAGNOSIS — G301 Alzheimer's disease with late onset: Secondary | ICD-10-CM | POA: Diagnosis not present

## 2017-08-21 DIAGNOSIS — R296 Repeated falls: Secondary | ICD-10-CM | POA: Diagnosis not present

## 2017-08-21 DIAGNOSIS — G6289 Other specified polyneuropathies: Secondary | ICD-10-CM | POA: Diagnosis not present

## 2017-08-27 ENCOUNTER — Encounter: Payer: Self-pay | Admitting: Licensed Clinical Social Worker

## 2017-08-27 ENCOUNTER — Ambulatory Visit (HOSPITAL_COMMUNITY)
Admission: RE | Admit: 2017-08-27 | Discharge: 2017-08-27 | Disposition: A | Discharge status: Home / Self Care | Payer: Medicare Other | Source: Ambulatory Visit | Attending: Cardiology | Admitting: Cardiology

## 2017-08-27 ENCOUNTER — Other Ambulatory Visit: Payer: Self-pay | Admitting: Licensed Clinical Social Worker

## 2017-08-27 DIAGNOSIS — I2510 Atherosclerotic heart disease of native coronary artery without angina pectoris: Secondary | ICD-10-CM | POA: Insufficient documentation

## 2017-08-27 DIAGNOSIS — I110 Hypertensive heart disease with heart failure: Secondary | ICD-10-CM | POA: Diagnosis not present

## 2017-08-27 DIAGNOSIS — Z8673 Personal history of transient ischemic attack (TIA), and cerebral infarction without residual deficits: Secondary | ICD-10-CM | POA: Diagnosis not present

## 2017-08-27 DIAGNOSIS — R062 Wheezing: Secondary | ICD-10-CM | POA: Diagnosis present

## 2017-08-27 DIAGNOSIS — Z87891 Personal history of nicotine dependence: Secondary | ICD-10-CM | POA: Insufficient documentation

## 2017-08-27 DIAGNOSIS — I4891 Unspecified atrial fibrillation: Secondary | ICD-10-CM | POA: Diagnosis not present

## 2017-08-27 DIAGNOSIS — I081 Rheumatic disorders of both mitral and tricuspid valves: Secondary | ICD-10-CM | POA: Diagnosis not present

## 2017-08-27 DIAGNOSIS — E785 Hyperlipidemia, unspecified: Secondary | ICD-10-CM | POA: Insufficient documentation

## 2017-08-27 DIAGNOSIS — R0602 Shortness of breath: Secondary | ICD-10-CM | POA: Insufficient documentation

## 2017-08-27 DIAGNOSIS — I5042 Chronic combined systolic (congestive) and diastolic (congestive) heart failure: Secondary | ICD-10-CM | POA: Diagnosis not present

## 2017-08-27 DIAGNOSIS — Z951 Presence of aortocoronary bypass graft: Secondary | ICD-10-CM | POA: Insufficient documentation

## 2017-08-27 DIAGNOSIS — I493 Ventricular premature depolarization: Secondary | ICD-10-CM | POA: Insufficient documentation

## 2017-08-27 NOTE — Patient Outreach (Signed)
Assessment:  CSW spoke via phone with Walter [name]. CSW verified identity of Walter [name]. CSW received verbal permission from Walter [name] on 08/27/17 for CSW to speak with Walter [name] about client needs and status. CSW informed Walter [name] on 08/27/17 that client had met client's care plan goals with [facility] CSW services. Thus, CSW informed Walter [name] on 08/27/17 that Walter [name] would discharge client from [place] on 08/27/17 since client had met his care plan goals. [name] agreed to this plan.  CSW encouraged that client continue to attend scheduled client appointments with primary care doctor. CSW thanked Walter [name] and client for participating with [facility] program support. Walter [name] was appreciative of support client had received with [facility] program.    Plan:  CSW is discharging Walter [name] from [place] on 08/27/17 since client  has met client's care plan goals with CSW services.  CSW to fax physician case closure letter to Walter [name] informing Walter [name] that [place] discharged client on 08/27/17 from [facility] CSW services.  [name].Ramiz Turpin MSW, LCSW Licensed Clinical Social Worker [facility] Care Management [phone]

## 2017-08-27 NOTE — Progress Notes (Signed)
*  PRELIMINARY RESULTS* Echocardiogram 2D Echocardiogram has been performed.  Leavy Cella 08/27/2017, 11:29 AM

## 2017-08-28 IMAGING — US US ABDOMEN COMPLETE
1 series · 13 of 25 positions shown · non-contrast
Comparison: CT abdomen pelvis of 04/06/2010

CLINICAL DATA: Epigastric pain for several months, prior
cholecystectomy, diabetes

EXAM:
ABDOMEN ULTRASOUND COMPLETE

[Series 1: us abdomen complete · 0.22mm/px · 13 of 127 slices shown]
[im 1/127]
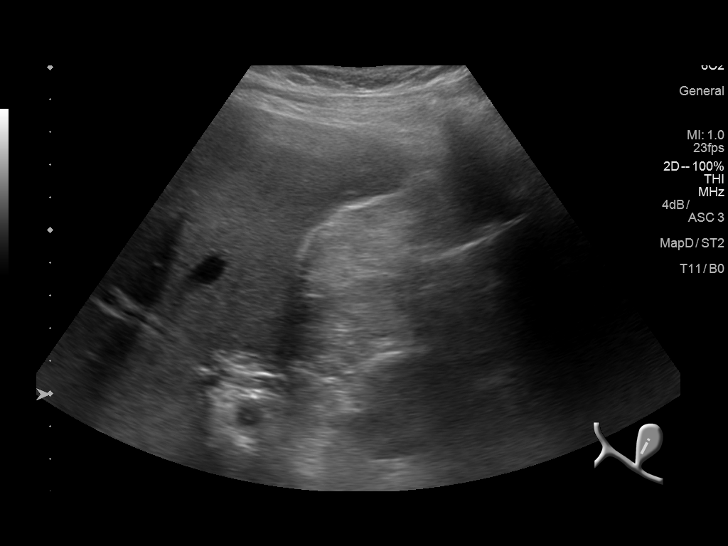
[im 11/127]
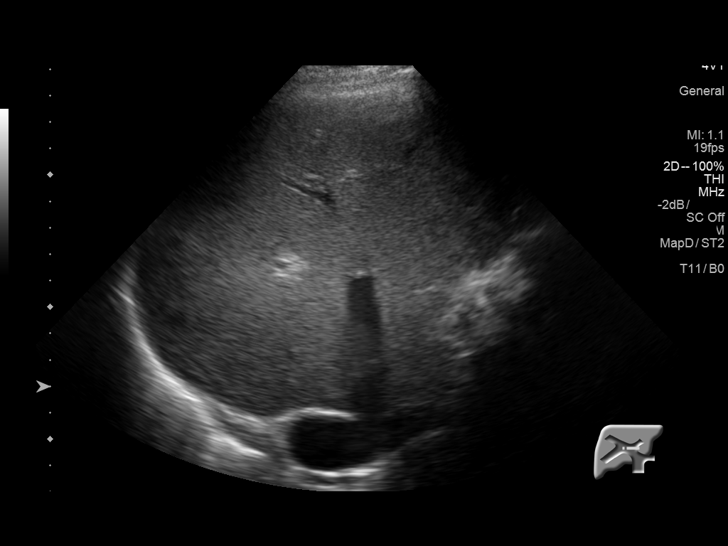
[im 22/127]
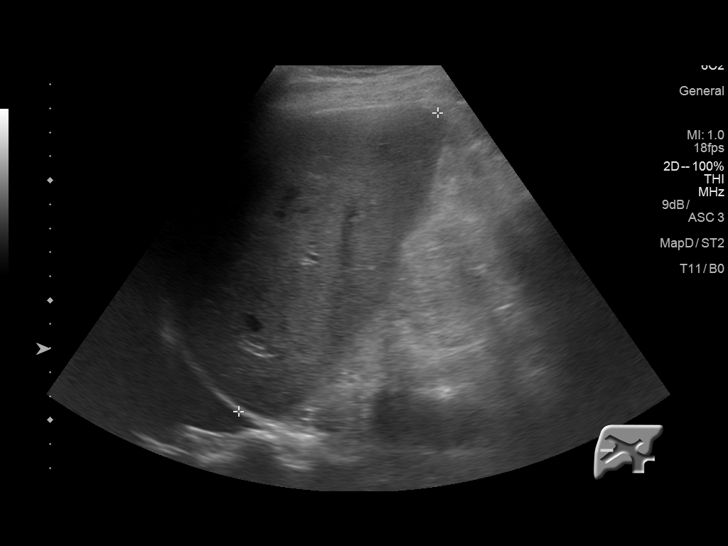
[im 32/127]
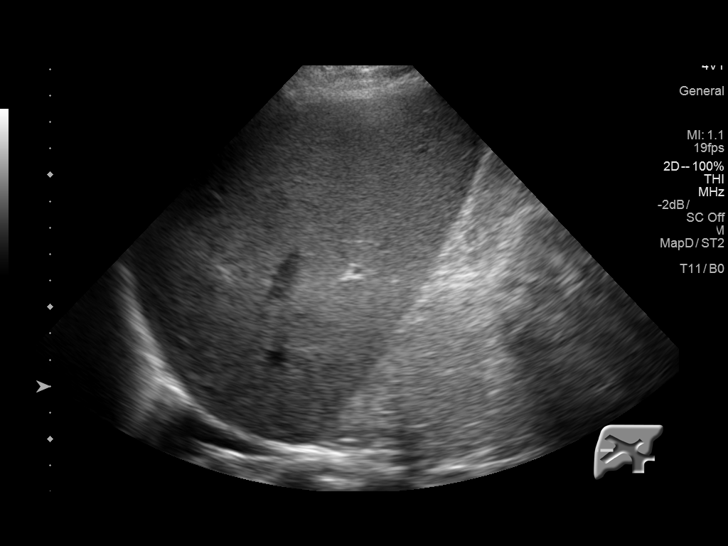
[im 43/127]
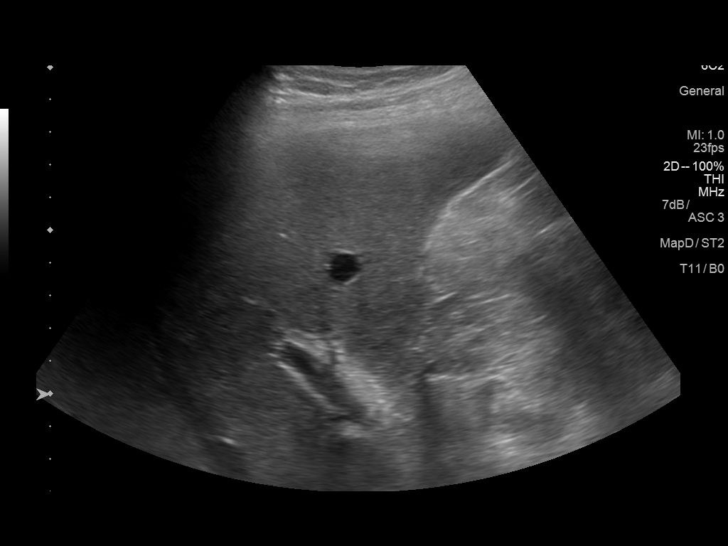
[im 53/127]
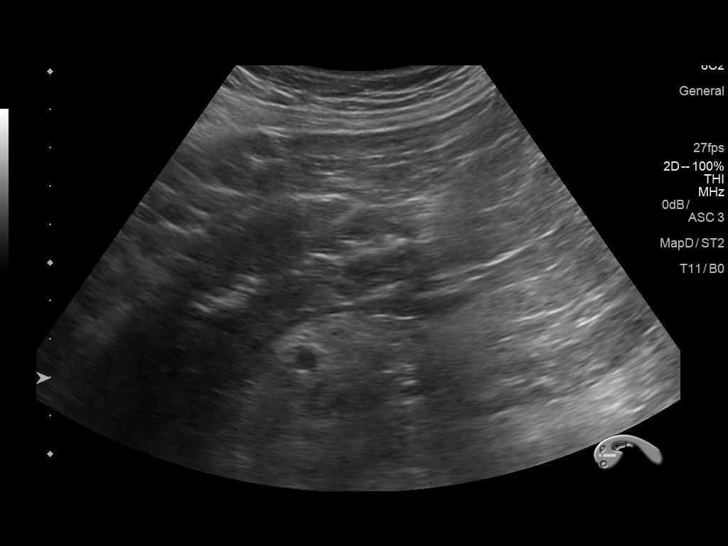
[im 64/127]
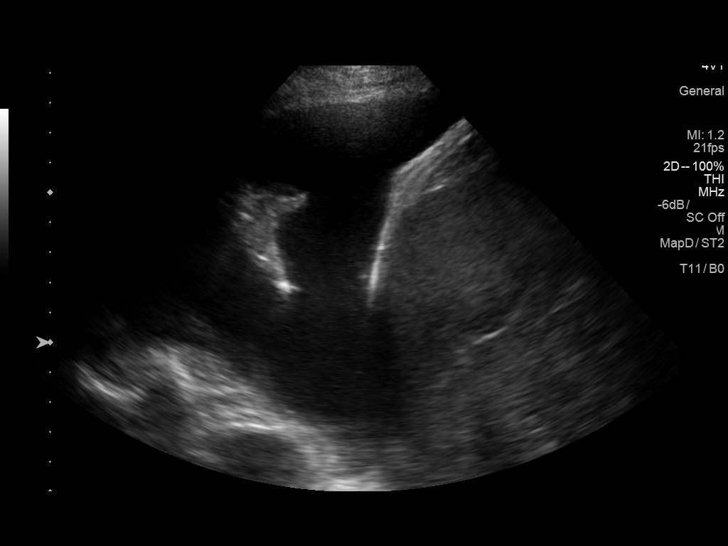
[im 74/127]
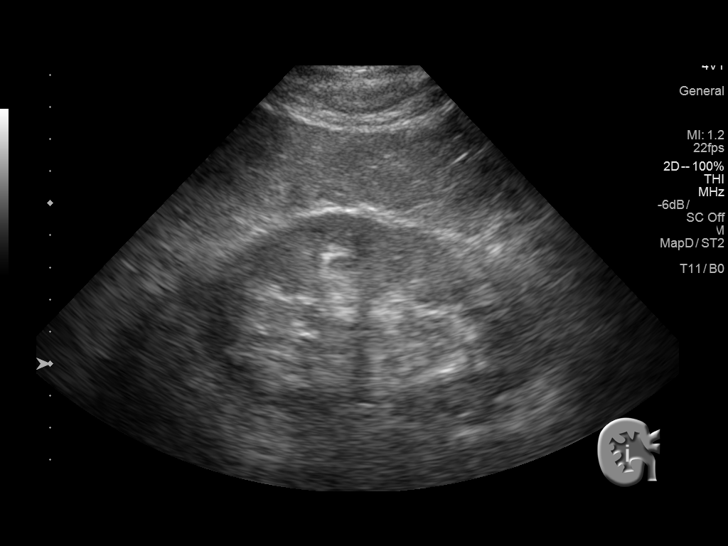
[im 85/127]
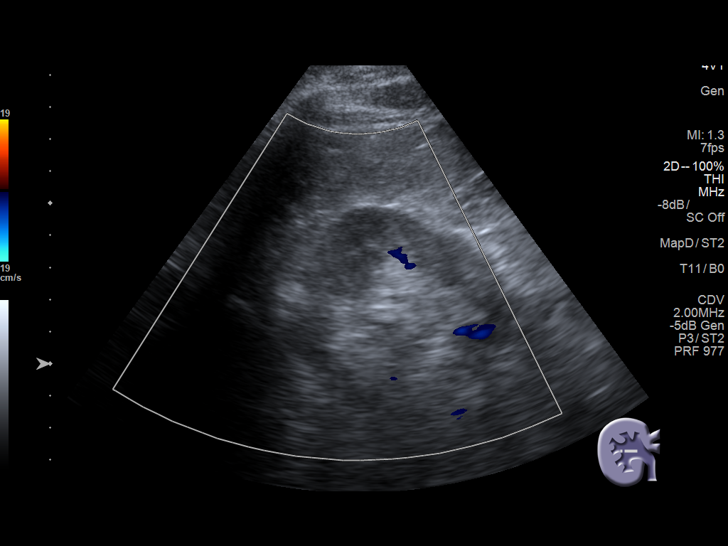
[im 95/127]
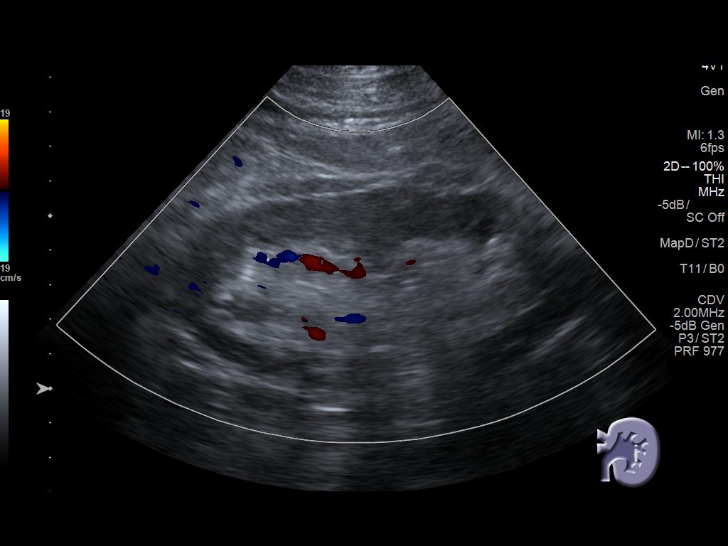
[im 106/127]
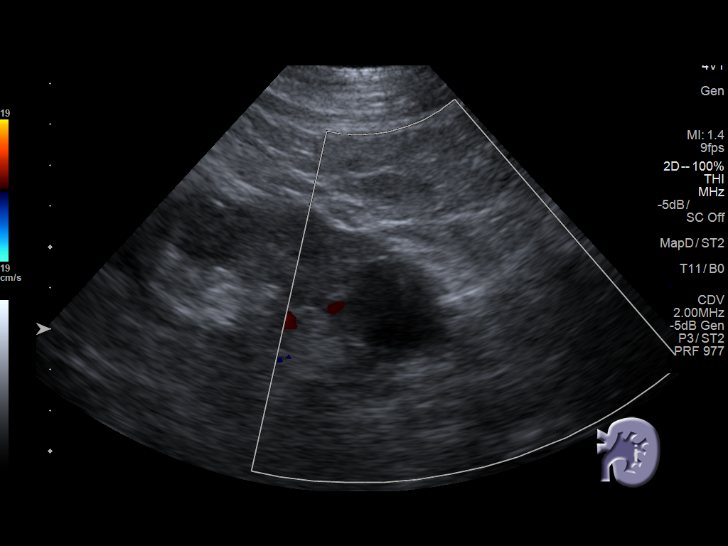
[im 116/127]
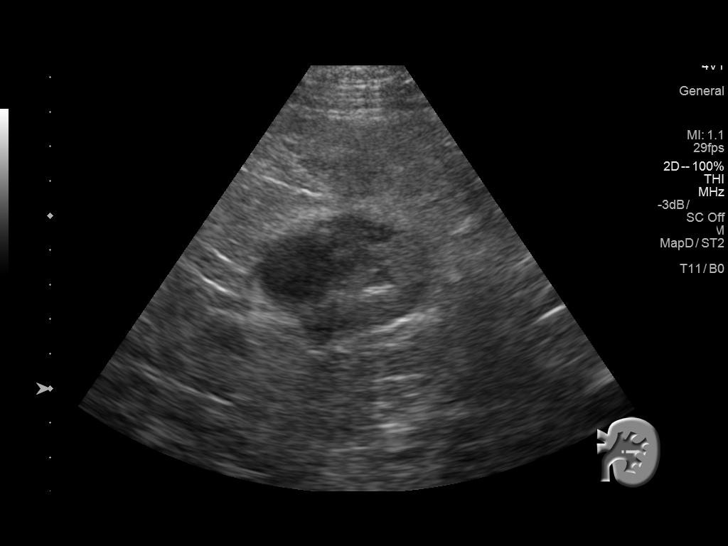
[im 127/127]
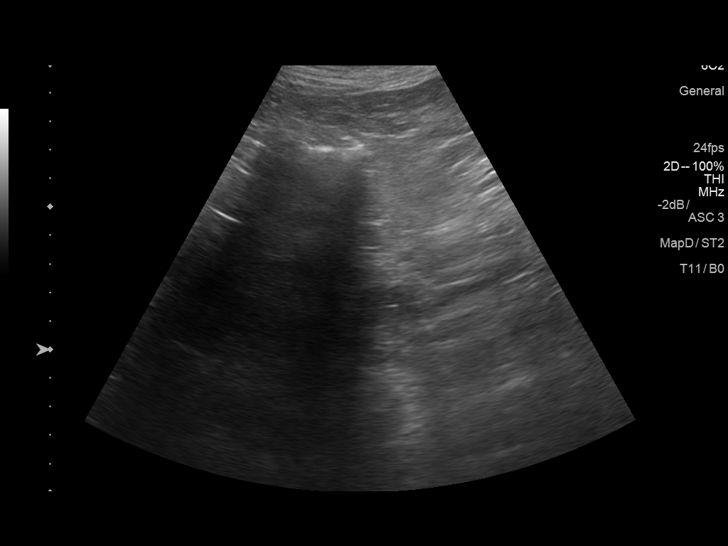

[13 of 25 positions shown; findings below may reference images not displayed]

FINDINGS: Gallbladder: The gallbladder has previously been resected.

Common bile duct: Diameter: The common bile duct measures 4.3 mm.

Liver: The liver slightly echogenic which may indicate mild fatty
infiltration. No focal hepatic abnormality is seen.

IVC: No abnormality visualized.

Pancreas: Portions of the pancreas are obscured by bowel gas, but
the midbody is unremarkable and the pancreatic duct does not appear
to be dilated.

Spleen: The spleen is normal measuring 8.9 cm.

Right Kidney: Length: 11.5 cm..  No hydronephrosis is seen.

Left Kidney: Length: 12.3 cm.. No hydronephrosis is seen. A
hypoechoic structure consistent with cyst emanates from the lower
pole measuring 2.7 cm. This cyst was not seen in review of the CT
from 4100. Followup ultrasound or CT may be helpful to assess
stability in 6 months.

Abdominal aorta: The abdominal aorta is partially obscured by bowel
gas and body habitus.

Other findings: There are bilateral pleural effusions left larger
than right.
IMPRESSION: 1. Bilateral pleural effusions left larger than right.
2. Echogenic liver parenchyma consistent with fatty infiltration. No
focal hepatic abnormality.
3. Portions of the pancreas and abdominal aorta are obscured by
bowel gas.
4. Probable 2.7 cm cyst in the lower pole of the left kidney, not
definitely seen on CT from 4100. Consider ultrasound or preferably
enhanced CT follow-up in 6 months.

## 2017-08-30 ENCOUNTER — Telehealth: Payer: Self-pay | Admitting: *Deleted

## 2017-08-30 NOTE — Telephone Encounter (Signed)
Pt aware and voiced understanding - scheduled 7/1 per pt request

## 2017-08-30 NOTE — Telephone Encounter (Signed)
-----   Message from Arnoldo Lenis, MD sent at 08/30/2017  3:04 PM EDT ----- Echo shows heart function has decreased mildly from last check. I would like to see him back earlier in 1 month   Zandra Abts MD

## 2017-09-20 DIAGNOSIS — G301 Alzheimer's disease with late onset: Secondary | ICD-10-CM | POA: Diagnosis not present

## 2017-09-20 DIAGNOSIS — R296 Repeated falls: Secondary | ICD-10-CM | POA: Diagnosis not present

## 2017-09-20 DIAGNOSIS — G6289 Other specified polyneuropathies: Secondary | ICD-10-CM | POA: Diagnosis not present

## 2017-10-04 DIAGNOSIS — I9581 Postprocedural hypotension: Secondary | ICD-10-CM | POA: Diagnosis not present

## 2017-10-04 DIAGNOSIS — L89152 Pressure ulcer of sacral region, stage 2: Secondary | ICD-10-CM | POA: Diagnosis not present

## 2017-10-21 DIAGNOSIS — G301 Alzheimer's disease with late onset: Secondary | ICD-10-CM | POA: Diagnosis not present

## 2017-10-21 DIAGNOSIS — G6289 Other specified polyneuropathies: Secondary | ICD-10-CM | POA: Diagnosis not present

## 2017-10-21 DIAGNOSIS — R296 Repeated falls: Secondary | ICD-10-CM | POA: Diagnosis not present

## 2017-10-26 ENCOUNTER — Encounter: Payer: Self-pay | Admitting: *Deleted

## 2017-10-29 ENCOUNTER — Other Ambulatory Visit: Payer: Self-pay

## 2017-10-29 ENCOUNTER — Encounter: Payer: Self-pay | Admitting: Cardiology

## 2017-10-29 ENCOUNTER — Ambulatory Visit: Payer: Medicare Other | Admitting: Cardiology

## 2017-10-29 VITALS — BP 131/76 | HR 86 | Ht 72.0 in | Wt 233.0 lb

## 2017-10-29 DIAGNOSIS — I4891 Unspecified atrial fibrillation: Secondary | ICD-10-CM | POA: Diagnosis not present

## 2017-10-29 DIAGNOSIS — I2510 Atherosclerotic heart disease of native coronary artery without angina pectoris: Secondary | ICD-10-CM

## 2017-10-29 DIAGNOSIS — I6523 Occlusion and stenosis of bilateral carotid arteries: Secondary | ICD-10-CM | POA: Diagnosis not present

## 2017-10-29 DIAGNOSIS — I5022 Chronic systolic (congestive) heart failure: Secondary | ICD-10-CM | POA: Diagnosis not present

## 2017-10-29 NOTE — Patient Instructions (Signed)

## 2017-10-29 NOTE — Progress Notes (Signed)
Clinical Summary Walter [name] is a 84 y.o.male seen today for follow up of the following medical problems.  1. CAD - history of prior CABG - nuclear stress 07/2010 with lateral and anterolateral scar, no active ischemia.   - no chest pain - compliant with meds   2. Chronic systolic HF - LVEF 39-76% by echo 07/2010 - echo 04/2011 LVEF 45-50%, abnormal diastolic function - no ACE-I due to poor renal function  07/2017 echo LVEF 30-35%, mod MR - no recent chest pain. Breathing back to normal. Mild swelling at times.  -  Limiting sodium intake. Home weights stabel at 229 lbs.  - chronic issues with balance, orthsotatic dizziness. Pcp recently lowered coreg due to low bp's.    3. Hyperlipidemia - he remains compliant with statin  4. HTN - compliant with meds  5. Afib - pcp stopped xarelto due to bleeding from mouth, penis, recutm per family report -= no recent palpitations.   6. Carotid stenosis 09/2016 mild bilateral disease -denies any neuro symptoms    Past Medical History:  Diagnosis Date  . Anxiety   . Aortic valve sclerosis    No significant stenosis, echo  . Atrial fibrillation (HCC)    Atrial fibrillation/flutter, rate control, Coumadin  . CAD (coronary artery disease)    Nuclear, August, 2005, extensive lateral scar, no ischemia /nuclear, August 09, 2010, large lateral and anterolateral scar with no ischemia, ejection fraction 26%  . Carotid artery disease [facility])    Dr. Scot Dock, Doppler, August, 7341, 93-79% LICA  . CHF (congestive heart failure) (HCC)    Systolic  . Chronic anticoagulation    Pradaxa  . CVA (cerebral infarction)    April, 2011 likely cardioembolic  . Diabetes mellitus    Diagnosis April, 2011  . Dyslipidemia   . Ejection fraction     EF 35-40%, echo, August 10, 2010  /      40-45% (better than before) Echo, January, 2010, ICD not indicated / EF 40%, echo, April, 2011  . GERD (gastroesophageal reflux disease)   . Hx of CABG     1998  . Hypertension   . Ischemic cardiomyopathy    Ischemic  . Leg weakness    Arterial leg Dopplers normal, 2009  . Mitral regurgitation    Mild, echo, January, 2010  . Peripheral neuropathy   . Positive D-dimer    D-dimer elevated, hospital, April, 2012, chest CT no pulmonary embolus  . PVC's (premature ventricular contractions)      No Known Allergies   Current Outpatient Medications  Medication Sig Dispense Refill  . ARTIFICIAL TEARS ophthalmic solution Place 1 drop into the left eye 2 (two) times daily.     Marland Kitchen aspirin 325 MG EC tablet Take 325 mg by mouth daily.    . brimonidine (ALPHAGAN P) 0.1 % SOLN Place 1 drop into the right eye 2 (two) times daily.      . carvedilol (COREG) 25 MG tablet Take 6.25 mg by mouth 2 (two) times daily with a meal.     . diazepam (VALIUM) 5 MG tablet Take 2.5 mg by mouth every 12 (twelve) hours as needed.      Marland Kitchen escitalopram (LEXAPRO) 10 MG tablet Take 1 tablet by mouth daily.    . fluticasone (FLONASE) 50 MCG/ACT nasal spray Place 2 sprays into both nostrils 2 (two) times daily.    . furosemide (LASIX) 40 MG tablet Take 40 mg by mouth daily.     Marland Kitchen  gabapentin (NEURONTIN) 300 MG capsule Take 300 mg by mouth 2 (two) times daily. Take one in morning and two in evening    . latanoprost (XALATAN) 0.005 % ophthalmic solution Place into both eyes at bedtime.    Marland Kitchen omeprazole (PRILOSEC OTC) 20 MG tablet Take 20 mg by mouth daily.      . simvastatin (ZOCOR) 40 MG tablet Take 20 mg by mouth at bedtime.     . Tamsulosin HCl (FLOMAX) 0.4 MG CAPS Take 0.4 mg by mouth 2 (two) times daily.      No current facility-administered medications for this visit.      Past Surgical History:  Procedure Laterality Date  . CORONARY ARTERY BYPASS GRAFT  1998     No Known Allergies    Family History  Problem Relation Age of Onset  . Hypertension Unknown   . Diabetes Unknown   . CAD Unknown      Social History Walter [name] reports that he quit smoking  about 35 years ago. His smoking use included cigarettes. He started smoking about 50 years ago. He has a 22.50 pack-year smoking history. He has never used smokeless tobacco. Walter [name] reports that he does not drink alcohol.   Review of Systems CONSTITUTIONAL: No weight loss, fever, chills, weakness or fatigue.  HEENT: Eyes: No visual loss, blurred vision, double vision or yellow sclerae.No hearing loss, sneezing, congestion, runny nose or sore throat.  SKIN: No rash or itching.  CARDIOVASCULAR: per hpi RESPIRATORY: No shortness of breath, cough or sputum.  GASTROINTESTINAL: No anorexia, nausea, vomiting or diarrhea. No abdominal pain or blood.  GENITOURINARY: No burning on urination, no polyuria NEUROLOGICAL: No headache, dizziness, syncope, paralysis, ataxia, numbness or tingling in the extremities. No change in bowel or bladder control.  MUSCULOSKELETAL: No muscle, back pain, joint pain or stiffness.  LYMPHATICS: No enlarged nodes. No history of splenectomy.  PSYCHIATRIC: No history of depression or anxiety.  ENDOCRINOLOGIC: No reports of sweating, cold or heat intolerance. No polyuria or polydipsia.  Marland Kitchen   Physical Examination Vitals:   10/29/17 0931  BP: 131/76  Pulse: 86  SpO2: 95%   Vitals:   10/29/17 0931  Weight: 233 lb (105.7 kg)  Height: 6' (1.829 m)    Gen: resting comfortably, no acute distress HEENT: no scleral icterus, pupils equal round and reactive, no palptable cervical adenopathy,  CV: irreg, no m/r/g, no jvd Resp: Clear to auscultation bilaterally GI: abdomen is soft, non-tender, non-distended, normal bowel sounds, no hepatosplenomegaly MSK: extremities are warm, no edema.  Skin: warm, no rash Neuro:  no focal deficits Psych: appropriate affect   Diagnostic Studies  07/2017 echo Study Conclusions  - Left ventricle: The cavity size was normal. Systolic function was   moderately to severely reduced. The estimated ejection fraction   was in the range  of 30% to 35%. Diffuse hypokinesis. The study   was not technically sufficient to allow evaluation of LV   diastolic dysfunction due to atrial fibrillation. Doppler   parameters are consistent with high ventricular filling pressure.   Moderate concentric and severe focal basal septal hypertrophy. - Regional wall motion abnormality: Severe hypokinesis of the basal   inferior, mid anterolateral, and apical lateral myocardium. - Aortic valve: Trileaflet; mildly thickened, mildly calcified   leaflets. Morphologically, there appears to be a very mild degree   of calcific aortic stenosis. - Mitral valve: There was moderate regurgitation. - Left atrium: The atrium was moderately to severely dilated. - Right ventricle: Systolic function  was mildly to moderately   reduced. - Right atrium: The atrium was mildly dilated. - Atrial septum: No defect or patent foramen ovale was identified. - Tricuspid valve: There was mild regurgitation. - Pulmonary arteries: PA peak pressure: 39 mm Hg (S). - Systemic veins: The IVC was dilated with normal respiratory   variation. Estimated CVP 8 mmHg.    Assessment and Plan  1. CAD - denies any recet chest pain, continue current meds  2. Chronic systolic HF - medical therapy limited by orthostatic symptoms and renal dysfunction.  - recent drop in LVEF, DIscussed with patient in detail, he is not in favor of considering invasive ischemic testing at this time. States he feels the best he has in years.  - continue medical therapy.   3. Carotid stenosis -mild bilateral disease, - continue medical therpay.   4. Hyperlipidemia - he will continue staitn  5. HTN - he is at goal. BP meds limited by orthostatic symptoms.   6. Afib - no symptoms. - taken off anticoag by pcp due to recurrent bleeding - continue current meds       Arnoldo Lenis, M.D.

## 2017-10-30 DIAGNOSIS — I482 Chronic atrial fibrillation: Secondary | ICD-10-CM | POA: Diagnosis not present

## 2017-10-30 DIAGNOSIS — Z0001 Encounter for general adult medical examination with abnormal findings: Secondary | ICD-10-CM | POA: Diagnosis not present

## 2017-10-30 DIAGNOSIS — I255 Ischemic cardiomyopathy: Secondary | ICD-10-CM | POA: Diagnosis not present

## 2017-10-30 DIAGNOSIS — E1142 Type 2 diabetes mellitus with diabetic polyneuropathy: Secondary | ICD-10-CM | POA: Diagnosis not present

## 2017-10-30 DIAGNOSIS — E782 Mixed hyperlipidemia: Secondary | ICD-10-CM | POA: Diagnosis not present

## 2017-11-20 DIAGNOSIS — G301 Alzheimer's disease with late onset: Secondary | ICD-10-CM | POA: Diagnosis not present

## 2017-11-20 DIAGNOSIS — R296 Repeated falls: Secondary | ICD-10-CM | POA: Diagnosis not present

## 2017-11-20 DIAGNOSIS — G6289 Other specified polyneuropathies: Secondary | ICD-10-CM | POA: Diagnosis not present

## 2017-12-21 DIAGNOSIS — R296 Repeated falls: Secondary | ICD-10-CM | POA: Diagnosis not present

## 2017-12-21 DIAGNOSIS — G6289 Other specified polyneuropathies: Secondary | ICD-10-CM | POA: Diagnosis not present

## 2017-12-21 DIAGNOSIS — G301 Alzheimer's disease with late onset: Secondary | ICD-10-CM | POA: Diagnosis not present

## 2018-01-21 DIAGNOSIS — G301 Alzheimer's disease with late onset: Secondary | ICD-10-CM | POA: Diagnosis not present

## 2018-01-21 DIAGNOSIS — R296 Repeated falls: Secondary | ICD-10-CM | POA: Diagnosis not present

## 2018-01-21 DIAGNOSIS — G6289 Other specified polyneuropathies: Secondary | ICD-10-CM | POA: Diagnosis not present

## 2018-01-28 DIAGNOSIS — E782 Mixed hyperlipidemia: Secondary | ICD-10-CM | POA: Diagnosis not present

## 2018-01-28 DIAGNOSIS — K219 Gastro-esophageal reflux disease without esophagitis: Secondary | ICD-10-CM | POA: Diagnosis not present

## 2018-01-28 DIAGNOSIS — I10 Essential (primary) hypertension: Secondary | ICD-10-CM | POA: Diagnosis not present

## 2018-02-06 DIAGNOSIS — Z23 Encounter for immunization: Secondary | ICD-10-CM | POA: Diagnosis not present

## 2018-02-06 DIAGNOSIS — G4700 Insomnia, unspecified: Secondary | ICD-10-CM | POA: Diagnosis not present

## 2018-02-20 ENCOUNTER — Other Ambulatory Visit: Payer: Self-pay

## 2018-02-20 NOTE — Patient Outreach (Signed)
Walter [name]) Care Management  02/20/2018  Walter [name] 08-08-1931 [id number]   Telephone Screen  Referral Date: 02/20/18 Referral Source: MD office Referral Reason: " HF, COPD,DM, HTN" Insurance: [facility] Medicare   Outreach attempt # 1 to patient. Spoke with spouse(DPR on file) given patient;s documental mental status.   Social: Patient resides in his home along with his spouse and adult son. Spouse reports that their grandson, who was living with them, died unexpectedly a few months ago. She states since then patient has declined and no longer able to do anything for himself. She voices that patient is total care and that she is having to bathe, feed and do everything for patient. She reports that patient is having multiple falls in the home. She voices that just this week alone patient has fallen several times but luckily no injuries sustained. DME in the home include BSC,cbg meter, lit chair and wheelchair. Patient's son and daughter are taking patient to MD appts.  Spouse feels that patient is "just getting worse" and she doesn't know how much longer she can mange caring for him. She is not ready to have patient placed but interested in any possible resources or in home support to assist her with managing his care.   Conditions: Per chart review, patient has PMH of Alzheimer's disease, DM(A1C 8.7-July 2019), peripheral neuropathy, GERD, HTN, HLD, ischemic cardiac disease, CHF, COPD, A-fib, CVA and BPH. Spouse voices that patient's mental status is worsening and progressing fast. She reports that he is no longer able to carry on a lot of meaningful conversations. She reports that he gets "agitated all the time" and is having some sundowner's. She is concerned about the increased swelling to legs that patient continues to have and is worsening per her report. She also voices that patient is having some SOB. Spouse does not know how to manage these conditions. She states that  currently he is sleeping in a lift chair as patient unable to tolerate lying flat. Spouse verbalizes being overwhelmed with patient's care and all his medical issues.   Medications: Spouse reports that patient is taking about ten meds. She is managing meds for patient . She voices that meds come via mail order. She denies needing any assistance with meds.    Appointments: Patient followed by PCP-seen on 02/06/18 and sees Dr. Tandy Gaw) about every six months-due to see again in Jan.  Advance Directives: None. Patient not cognitively able to complete.   Consent: [facility] services reviewed and discussed with spouse. She is familiar with services as she has had them in the past. Spouse requesting previous RN and SW who was assigned to patient case. Advised her that RN CM could not guarantee that as assignments made on geographic areas. Advised spouse that someone would follow up with her within 10 business days. Spouse requesting someone to come out within the next day or two and advised her of [facility] process. Suggested to spouse if she needed services sooner to speak with PCP regarding regular [facility] services.    Plan: RN CM will send [facility] SW referral for possible community resources/ in home assistance to help with caregiver fatigue. RN CM will send [facility] community nurse referral for further in home eval/assessment of care needs and mgmt of chronic conditions and symptom management.   Enzo Montgomery, RN,BSN,CCM [facility] Telephonic Care Management Coordinator Direct Phone: [phone] Toll Free: [phone] Fax: [phone]

## 2018-02-22 ENCOUNTER — Other Ambulatory Visit: Payer: Self-pay | Admitting: *Deleted

## 2018-02-22 NOTE — Patient Outreach (Signed)
Referral received from MD office/ [facility] telephonic care manager, social work also involved, RN CM spoke with patient's spouse  [place] Rodelo HIPAA verified, initial home visit scheduled for next week.  RN CM sent in basket to [place] reporting wife [name] states she does not know how much longer she can care for pt in the home due to worsening dementia, wife reports they have no money to pay for care in the home and not sure how they would pay for a facility and would like assistance with this.   PLAN See pt for initial home visit next week Collaborate with [facility] [place], [place] Coordinator [phone]

## 2018-02-23 DIAGNOSIS — R531 Weakness: Secondary | ICD-10-CM | POA: Diagnosis not present

## 2018-02-23 DIAGNOSIS — R0902 Hypoxemia: Secondary | ICD-10-CM | POA: Diagnosis not present

## 2018-02-25 ENCOUNTER — Other Ambulatory Visit: Payer: Self-pay

## 2018-02-25 NOTE — Patient Outreach (Signed)
[facility]) Care Management  02/25/2018  Walter [name] 1931-06-11 [id number]   Initial outreach to patient's spouse, [name], regarding social work referral for in-home support/resources.  [name] reported that a home health nurse came to complete an assessment on 02/23/18.  [name] could not remember the name of the agency.  Per [name], the nurse felt that hospice is a more appropriate service at this time. She confirmed that this agency is facilitating this process and that she was contacted by them again this morning regarding the process.   BSW sent in-basket message to [facility], [name] to provide this update.  [name] is scheduled for a home visit with patient tomorrow.  BSW will follow up with [name] before the end of the week to get an status update on hospice services.  [name], BSW Social Worker [phone]

## 2018-02-26 ENCOUNTER — Ambulatory Visit: Payer: Self-pay | Admitting: *Deleted

## 2018-02-26 ENCOUNTER — Other Ambulatory Visit: Payer: Self-pay | Admitting: *Deleted

## 2018-02-26 NOTE — Patient Outreach (Signed)
Telephone call to patient's wife for follow up as pt was evaluated by in home hospice services yesterday, per wife [name], pt was admitted to in home hospice services yesterday,  No other questions or concerns voiced.  Case closed,  Home visit for today cancelled. RN CM sent in basket to [place] of discharge.   PLAN Close case  [name] [facility], [place] Coordinator [phone]

## 2018-02-27 ENCOUNTER — Other Ambulatory Visit: Payer: Self-pay

## 2018-02-27 NOTE — Patient Outreach (Signed)
[facility]) Care Management  02/27/2018  Walter [name] 22-Jan-1932 [id number]   Patient accepted to hospice services on 02/26/18.  BSW is closing case at this time.  [name], BSW Social Worker [phone]

## 2018-02-28 ENCOUNTER — Ambulatory Visit: Payer: Medicare Other

## 2018-03-01 DIAGNOSIS — E1121 Type 2 diabetes mellitus with diabetic nephropathy: Secondary | ICD-10-CM | POA: Diagnosis not present

## 2018-03-01 DIAGNOSIS — I482 Chronic atrial fibrillation, unspecified: Secondary | ICD-10-CM | POA: Diagnosis not present

## 2018-03-01 DIAGNOSIS — G301 Alzheimer's disease with late onset: Secondary | ICD-10-CM | POA: Diagnosis not present

## 2018-03-01 DIAGNOSIS — I10 Essential (primary) hypertension: Secondary | ICD-10-CM | POA: Diagnosis not present

## 2018-03-01 DIAGNOSIS — Z515 Encounter for palliative care: Secondary | ICD-10-CM | POA: Diagnosis not present

## 2018-03-31 DEATH — deceased
